# Patient Record
Sex: Female | Born: 1935 | Race: White | Hispanic: No | Smoking: Never smoker
Health system: Southern US, Community
[De-identification: ages and names within clinical notes are randomized; demographics above are authoritative.]

## PROBLEM LIST (undated history)

## (undated) DIAGNOSIS — I6381 Other cerebral infarction due to occlusion or stenosis of small artery: Secondary | ICD-10-CM

## (undated) DIAGNOSIS — I495 Sick sinus syndrome: Secondary | ICD-10-CM

## (undated) DIAGNOSIS — N289 Disorder of kidney and ureter, unspecified: Secondary | ICD-10-CM

## (undated) DIAGNOSIS — I4891 Unspecified atrial fibrillation: Principal | ICD-10-CM

## (undated) DIAGNOSIS — I509 Heart failure, unspecified: Secondary | ICD-10-CM

## (undated) DIAGNOSIS — J189 Pneumonia, unspecified organism: Secondary | ICD-10-CM

## (undated) DIAGNOSIS — R7881 Bacteremia: Secondary | ICD-10-CM

## (undated) DIAGNOSIS — K219 Gastro-esophageal reflux disease without esophagitis: Secondary | ICD-10-CM

## (undated) HISTORY — PX: TONSILLECTOMY: SUR1361

---

## 2011-12-30 ENCOUNTER — Inpatient Hospital Stay (HOSPITAL_COMMUNITY)
Admission: EM | Admit: 2011-12-30 | Discharge: 2012-01-03 | DRG: 309 | Disposition: A | Discharge status: Home / Self Care | Payer: Medicare Other | Attending: Cardiovascular Disease | Admitting: Cardiovascular Disease

## 2011-12-30 ENCOUNTER — Encounter: Payer: Self-pay | Admitting: Emergency Medicine

## 2011-12-30 DIAGNOSIS — Z7901 Long term (current) use of anticoagulants: Secondary | ICD-10-CM

## 2011-12-30 DIAGNOSIS — I6381 Other cerebral infarction due to occlusion or stenosis of small artery: Secondary | ICD-10-CM

## 2011-12-30 DIAGNOSIS — K219 Gastro-esophageal reflux disease without esophagitis: Secondary | ICD-10-CM | POA: Diagnosis present

## 2011-12-30 DIAGNOSIS — Z79899 Other long term (current) drug therapy: Secondary | ICD-10-CM

## 2011-12-30 DIAGNOSIS — I4891 Unspecified atrial fibrillation: Secondary | ICD-10-CM

## 2011-12-30 DIAGNOSIS — Z7982 Long term (current) use of aspirin: Secondary | ICD-10-CM

## 2011-12-30 DIAGNOSIS — Z8673 Personal history of transient ischemic attack (TIA), and cerebral infarction without residual deficits: Secondary | ICD-10-CM

## 2011-12-30 DIAGNOSIS — N390 Urinary tract infection, site not specified: Secondary | ICD-10-CM | POA: Diagnosis present

## 2011-12-30 DIAGNOSIS — R4182 Altered mental status, unspecified: Secondary | ICD-10-CM

## 2011-12-30 HISTORY — DX: Unspecified atrial fibrillation: I48.91

## 2011-12-30 HISTORY — DX: Gastro-esophageal reflux disease without esophagitis: K21.9

## 2011-12-30 HISTORY — DX: Other cerebral infarction due to occlusion or stenosis of small artery: I63.81

## 2011-12-30 NOTE — ED Notes (Signed)
Pt's niece st's she noticed this am that pt just did not seem right.  St's approx.  7pm tonight she spoke to pt on phone and pt could not finish a sentence.  Pt is alert and orineted x's 3 at this time but has to think for a while before answering.  Niece st's this am pt forgot to put underwear on.  Forgetting where her clothes are located. Pt lives alone.

## 2011-12-31 ENCOUNTER — Emergency Department (HOSPITAL_COMMUNITY): Payer: Medicare Other

## 2011-12-31 ENCOUNTER — Other Ambulatory Visit: Payer: Self-pay

## 2011-12-31 ENCOUNTER — Encounter (HOSPITAL_COMMUNITY): Payer: Self-pay | Admitting: Physical Medicine and Rehabilitation

## 2011-12-31 DIAGNOSIS — I4891 Unspecified atrial fibrillation: Principal | ICD-10-CM

## 2011-12-31 DIAGNOSIS — N390 Urinary tract infection, site not specified: Secondary | ICD-10-CM

## 2011-12-31 LAB — CARDIAC PANEL(CRET KIN+CKTOT+MB+TROPI)
CK, MB: 2.8 ng/mL (ref 0.3–4.0)
CK, MB: 2.8 ng/mL (ref 0.3–4.0)
Relative Index: 1.8 (ref 0.0–2.5)
Troponin I: <0.3 ng/mL (ref <0.30)
Troponin I: 0.37 ng/mL (ref <0.30)

## 2011-12-31 LAB — POCT I-STAT TROPONIN I: Troponin i, poc: 0.22 ng/mL (ref 0.00–0.08)

## 2011-12-31 LAB — DIFFERENTIAL
Basophils Relative: 0 % (ref 0–1)
Eosinophils Absolute: 0 10*3/uL (ref 0.0–0.7)
Neutro Abs: 5.7 10*3/uL (ref 1.7–7.7)
Neutrophils Relative %: 59 % (ref 43–77)

## 2011-12-31 LAB — URINE MICROSCOPIC-ADD ON

## 2011-12-31 LAB — URINALYSIS, ROUTINE W REFLEX MICROSCOPIC
Nitrite: NEGATIVE
Protein, ur: NEGATIVE mg/dL
Specific Gravity, Urine: 1.026 (ref 1.005–1.030)
Urobilinogen, UA: 0.2 mg/dL (ref 0.0–1.0)

## 2011-12-31 LAB — CBC
MCH: 31.7 pg (ref 26.0–34.0)
MCHC: 34.6 g/dL (ref 30.0–36.0)
Platelets: 238 10*3/uL (ref 150–400)
RBC: 4.98 MIL/uL (ref 3.87–5.11)

## 2011-12-31 LAB — APTT: aPTT: 31 seconds (ref 24–37)

## 2011-12-31 LAB — GLUCOSE, CAPILLARY

## 2011-12-31 LAB — HEMOGLOBIN A1C
Hgb A1c MFr Bld: 6.2 % — ABNORMAL HIGH (ref <5.7)
Mean Plasma Glucose: 131 mg/dL — ABNORMAL HIGH (ref <117)

## 2011-12-31 LAB — POCT I-STAT, CHEM 8
Calcium, Ion: 1.09 mmol/L — ABNORMAL LOW (ref 1.12–1.32)
Chloride: 100 mEq/L (ref 96–112)
HCT: 45 % (ref 36.0–46.0)
Potassium: 3.7 mEq/L (ref 3.5–5.1)
Sodium: 138 mEq/L (ref 135–145)

## 2011-12-31 LAB — PROTIME-INR: INR: 1.1 (ref 0.00–1.49)

## 2011-12-31 MED ORDER — SODIUM CHLORIDE 0.9 % IJ SOLN
3.0000 mL | INTRAMUSCULAR | Status: DC | PRN
  Administered 2012-01-02: 3 mL via INTRAVENOUS
  Administered 2012-01-02: 12:00:00 via INTRAVENOUS

## 2011-12-31 MED ORDER — SODIUM CHLORIDE 0.9 % IJ SOLN
3.0000 mL | Freq: Two times a day (BID) | INTRAMUSCULAR | Status: DC
  Administered 2011-12-31 – 2012-01-03 (×5): 3 mL via INTRAVENOUS

## 2011-12-31 MED ORDER — CIPROFLOXACIN HCL 250 MG PO TABS
250.0000 mg | ORAL_TABLET | Freq: Two times a day (BID) | ORAL | Status: AC
  Administered 2011-12-31 – 2012-01-02 (×6): 250 mg via ORAL
  Filled 2011-12-31 (×7): qty 1

## 2011-12-31 MED ORDER — ASPIRIN EC 81 MG PO TBEC
81.0000 mg | DELAYED_RELEASE_TABLET | Freq: Every day | ORAL | Status: DC
  Administered 2012-01-01: 81 mg via ORAL
  Filled 2011-12-31 (×3): qty 1

## 2011-12-31 MED ORDER — ASPIRIN 81 MG PO CHEW
324.0000 mg | CHEWABLE_TABLET | Freq: Once | ORAL | Status: AC
  Administered 2011-12-31: 324 mg via ORAL
  Filled 2011-12-31: qty 4

## 2011-12-31 MED ORDER — DILTIAZEM HCL 100 MG IV SOLR
5.0000 mg/h | INTRAVENOUS | Status: AC
  Administered 2011-12-31 (×2): 20 mg/h via INTRAVENOUS
  Administered 2011-12-31: 15 mg/h via INTRAVENOUS
  Administered 2011-12-31: 20 mg/h via INTRAVENOUS
  Administered 2012-01-01 (×3): 15 mg/h via INTRAVENOUS
  Filled 2011-12-31 (×4): qty 100

## 2011-12-31 MED ORDER — HEPARIN SOD (PORCINE) IN D5W 100 UNIT/ML IV SOLN
750.0000 [IU]/h | INTRAVENOUS | Status: DC
  Administered 2011-12-31 – 2012-01-01 (×2): 800 [IU]/h via INTRAVENOUS
  Administered 2012-01-03: 750 [IU]/h via INTRAVENOUS
  Filled 2011-12-31 (×4): qty 250

## 2011-12-31 MED ORDER — HEPARIN BOLUS VIA INFUSION
2800.0000 [IU] | Freq: Once | INTRAVENOUS | Status: AC
  Administered 2011-12-31: 2800 [IU] via INTRAVENOUS

## 2011-12-31 MED ORDER — ONDANSETRON HCL 4 MG/2ML IJ SOLN: 4.0000 mg | Freq: Four times a day (QID) | INTRAMUSCULAR | Status: DC | PRN

## 2011-12-31 MED ORDER — SODIUM CHLORIDE 0.9 % IV BOLUS (SEPSIS)
1000.0000 mL | Freq: Once | INTRAVENOUS | Status: AC
  Administered 2011-12-31: 1000 mL via INTRAVENOUS

## 2011-12-31 MED ORDER — SODIUM CHLORIDE 0.9 % IV SOLN
250.0000 mL | INTRAVENOUS | Status: DC | PRN
  Administered 2011-12-31: 20 mL via INTRAVENOUS

## 2011-12-31 MED ORDER — ACETAMINOPHEN 325 MG PO TABS: 650.0000 mg | ORAL_TABLET | ORAL | Status: DC | PRN

## 2011-12-31 MED ORDER — WARFARIN SODIUM 5 MG PO TABS
5.0000 mg | ORAL_TABLET | Freq: Once | ORAL | Status: AC
  Administered 2011-12-31: 5 mg via ORAL
  Filled 2011-12-31: qty 1

## 2011-12-31 MED ORDER — DILTIAZEM HCL 50 MG/10ML IV SOLN
10.0000 mg | Freq: Once | INTRAVENOUS | Status: AC
  Administered 2011-12-31: 10 mg via INTRAVENOUS

## 2011-12-31 MED ORDER — COUMADIN BOOK
Freq: Once | Status: AC
  Administered 2011-12-31: 14:00:00
  Filled 2011-12-31: qty 1

## 2011-12-31 MED ORDER — WARFARIN VIDEO
Freq: Once | Status: AC
  Administered 2011-12-31: 14:00:00

## 2011-12-31 MED ORDER — SULFAMETHOXAZOLE-TMP DS 800-160 MG PO TABS: 1.0000 | ORAL_TABLET | Freq: Two times a day (BID) | ORAL | Status: DC

## 2011-12-31 MED ORDER — DILTIAZEM HCL 100 MG IV SOLR
5.0000 mg/h | Freq: Once | INTRAVENOUS | Status: AC
  Administered 2011-12-31: 10 mg/h via INTRAVENOUS

## 2011-12-31 NOTE — Progress Notes (Signed)
Iv team called for second iv, due to pt being on dil gtts, and needing to start heaprin.

## 2011-12-31 NOTE — Progress Notes (Signed)
  Patient Name: [name] Daniels      SUBJECTIVE: Patient presented to the hospital with atrial fibrillation and rapid ventricular response with complaints of weakness. She was treated with heparin and Cardizem her heart rate is some improved.  Past Medical History  Diagnosis Date  . Campath-induced atrial fibrillation   . Shortness of breath   . GERD (gastroesophageal reflux disease)     PHYSICAL EXAM Filed Vitals:   12/31/11 0900 12/31/11 0915 12/31/11 0945 12/31/11 1000  BP: 78/62 92/56 90/45  87/42  Pulse: 102 148 79 114  Temp:      TempSrc:      Resp: 21 21 18 18   Height:      Weight:      SpO2: 96% 95% 95% 96%    Well developed and nourished in no acute distress HENT normal Neck supple with JVP-flat Carotids brisk and full without bruits Clear Irregularly irregular rate and rhythm with  Rapid  ventricular response, no murmurs or gallops Abd-soft with active BS without hepatomegaly No Clubbing cyanosis edema Skin-warm and dry A & Oriented  Grossly normal sensory and motor function   TELEMETRY: Reviewed telemetry pt in af with variable ventriaular response:   No intake or output data in the 24 hours ending 12/31/11 1025  LABS: Basic Metabolic Panel:  Lab 12/31/11 1610  NA 138  K 3.7  CL 100  CO2 --  GLUCOSE 119*  BUN 7  CREATININE 0.80  CALCIUM --  MG --  PHOS --   Cardiac Enzymes: No results found for this basename: CKTOTAL:3,CKMB:3,CKMBINDEX:3,TROPONINI:3 in the last 72 hours CBC:  Lab 12/31/11 0120 12/31/11 0026  WBC -- 9.6  NEUTROABS -- 5.7  HGB 15.3* 15.8*  HCT 45.0 45.7  MCV -- 91.8  PLT -- 238      ASSESSMENT AND PLAN:  The patient presents with atrial fibrillation of unknown duration with rapid ventricular response in the setting of urinary tract infection. Hypotension has made control the rate difficult. Thyroid functions are pending. We will begin Coumadin in anticipation of cardioversion; there are no great data  for alternative  anticoagulants with heparin. Await echo and TSH Begin trimethoprim sulfa for UTI x3 days  Patient Active Hospital Problem List: Atrial fibrillation (12/31/2011)   UTI (lower urinary tract infection) (12/31/2011)     Signed, Sherryl Manges MD  12/31/2011

## 2011-12-31 NOTE — ED Notes (Signed)
RN and MD notified of pt's change to vital signs

## 2011-12-31 NOTE — Progress Notes (Signed)
Pt received from ed via stretcher, pt in no apparent distress. Pt placed on tele monitor, vss, afib on tele monitor, diltazem gtts infusing thru 20G left ac, site appears wnl. Dr. [name] in to see pt. Will continue to monitor.

## 2011-12-31 NOTE — Progress Notes (Signed)
CRITICAL VALUE ALERT  Critical value received:  troponin 0.037 Date of notification:  12/31/2011  Time of notification:  1245  Critical value read back:yes  Nurse who received alert:  Marcy Salvo rn  MD notified (1st page):  Hurman Horn md  Time of first page:  1245  MD notified (2nd page):  Time of second page:  Responding MD:  Hurman Horn md  Time MD responded:  1245

## 2011-12-31 NOTE — H&P (Signed)
[name] Daniels is an 78 y.o. female.    Chief Complaint:  "feeling sick"  History was obtained from patient and the patient's niece, who is the caretaker Gavin Pound March [phone])  HPI: 78 y/o female with no significant PMH presented to ER with complains of "feeling sick" for 2 days prior to presentation.  She complained of feeling sick when she was a [employer] 2 days prior consisting of nausea with no vomiting.  She denies any chest pain, shortness of breath, PND, orthopnea, dizziness or syncope. In ER, she was found to be in AFIB with RVR with heart rates ranging from 120 bpm to 170 bpm. Patient remained hemodynamically stable and was asymptomatic throughout.  Her initial cardiac markers shows a troponin-I of 0.22. She had a CT-head which did not show any evidence of acute intracranial abnormality and a CXR which did not show any evidence of pulmonary edema. She was started on heparin and Cardizem drip in theER.  History reviewed. No pertinent past medical history.  No past surgical history on file.  No family history on file. Social History:  reports that she has never smoked. She does not have any smokeless tobacco history on file. She reports that she does not drink alcohol or use illicit drugs.  Allergies: No Known Allergies  Medications Prior to Admission  Medication Dose Route Frequency Provider Last Rate Last Dose  . aspirin chewable tablet 324 mg  324 mg Oral Once April K Palumbo-Rasch, MD   324 mg at 12/31/11 0222  . diltiazem (CARDIZEM) 100 mg in dextrose 5 % 100 mL infusion  5-15 mg/hr Intravenous Once April K Palumbo-Rasch, MD 15 mL/hr at 12/31/11 0300 15 mg/hr at 12/31/11 0300  . diltiazem (CARDIZEM) injection SOLN 10 mg  10 mg Intravenous Once April K Palumbo-Rasch, MD   10 mg at 12/31/11 0210  . sodium chloride 0.9 % bolus 1,000 mL  1,000 mL Intravenous Once April K Palumbo-Rasch, MD   1,000 mL at 12/31/11 0211   No current outpatient prescriptions on file as of 12/30/2011.     Results for orders placed during the hospital encounter of 12/30/11 (from the past 48 hour(s))  CBC     Status: Abnormal   Collection Time   12/31/11 12:26 AM      Component Value Range Comment   WBC 9.6  4.0 - 10.5 (K/uL)    RBC 4.98  3.87 - 5.11 (MIL/uL)    Hemoglobin 15.8 (*) 12.0 - 15.0 (g/dL)    HCT 11.9  14.7 - 82.9 (%)    MCV 91.8  78.0 - 100.0 (fL)    MCH 31.7  26.0 - 34.0 (pg)    MCHC 34.6  30.0 - 36.0 (g/dL)    RDW 56.2  13.0 - 86.5 (%)    Platelets 238  150 - 400 (K/uL)   DIFFERENTIAL     Status: Normal   Collection Time   12/31/11 12:26 AM      Component Value Range Comment   Neutrophils Relative 59  43 - 77 (%)    Neutro Abs 5.7  1.7 - 7.7 (K/uL)    Lymphocytes Relative 29  12 - 46 (%)    Lymphs Abs 2.8  0.7 - 4.0 (K/uL)    Monocytes Relative 11  3 - 12 (%)    Monocytes Absolute 1.0  0.1 - 1.0 (K/uL)    Eosinophils Relative 0  0 - 5 (%)    Eosinophils Absolute 0.0  0.0 - 0.7 (K/uL)  Basophils Relative 0  0 - 1 (%)    Basophils Absolute 0.0  0.0 - 0.1 (K/uL)   GLUCOSE, CAPILLARY     Status: Abnormal   Collection Time   12/31/11 12:26 AM      Component Value Range Comment   Glucose-Capillary 113 (*) 70 - 99 (mg/dL)    Comment 1 Documented in Chart      Comment 2 Notify RN     URINALYSIS, ROUTINE W REFLEX MICROSCOPIC     Status: Abnormal   Collection Time   12/31/11 12:34 AM      Component Value Range Comment   Color, Urine YELLOW  YELLOW     APPearance CLEAR  CLEAR     Specific Gravity, Urine 1.026  1.005 - 1.030     pH 5.5  5.0 - 8.0     Glucose, UA NEGATIVE  NEGATIVE (mg/dL)    Hgb urine dipstick NEGATIVE  NEGATIVE     Bilirubin Urine SMALL (*) NEGATIVE     Ketones, ur 15 (*) NEGATIVE (mg/dL)    Protein, ur NEGATIVE  NEGATIVE (mg/dL)    Urobilinogen, UA 0.2  0.0 - 1.0 (mg/dL)    Nitrite NEGATIVE  NEGATIVE     Leukocytes, UA MODERATE (*) NEGATIVE    URINE MICROSCOPIC-ADD ON     Status: Normal   Collection Time   12/31/11 12:34 AM      Component Value  Range Comment   Squamous Epithelial / LPF RARE  RARE     WBC, UA 7-10  <3 (WBC/hpf)    RBC / HPF 0-2  <3 (RBC/hpf)    Bacteria, UA RARE  RARE     Urine-Other MUCOUS PRESENT     POCT I-STAT TROPONIN I     Status: Abnormal   Collection Time   12/31/11  1:18 AM      Component Value Range Comment   Troponin i, poc 0.22 (*) 0.00 - 0.08 (ng/mL)    Comment NOTIFIED PHYSICIAN      Comment 3            POCT I-STAT, CHEM 8     Status: Abnormal   Collection Time   12/31/11  1:20 AM      Component Value Range Comment   Sodium 138  135 - 145 (mEq/L)    Potassium 3.7  3.5 - 5.1 (mEq/L)    Chloride 100  96 - 112 (mEq/L)    BUN 7  6 - 23 (mg/dL)    Creatinine, Ser 9.14  0.50 - 1.10 (mg/dL)    Glucose, Bld 782 (*) 70 - 99 (mg/dL)    Calcium, Ion 9.56 (*) 1.12 - 1.32 (mmol/L)    TCO2 26  0 - 100 (mmol/L)    Hemoglobin 15.3 (*) 12.0 - 15.0 (g/dL)    HCT 21.3  08.6 - 57.8 (%)    Dg Chest 2 View  12/31/2011  *RADIOLOGY REPORT*  Clinical Data: Altered mental status.  CHEST - 2 VIEW  Comparison: None.  Findings: Hyperinflation.  Mild interstitial prominence.  Mild left lung base scarring versus atelectasis.  Small hiatal hernia. Otherwise, no focal areas of consolidation.  Aortic arch atherosclerotic calcification.  Heart size within upper normal limits.  Mild compression deformity of a lower thoracic/upper lumbar vertebrae.  Osteopenia.  IMPRESSION: Mild hyperinflation and left lung base scarring versus atelectasis.  Small hiatal hernia.  Mild age indeterminate compression deformity of a lower thoracic and upper lumbar vertebra.  Original Report Authenticated By:  Waneta Martins, M.D.   Ct Head Wo Contrast  12/31/2011  *RADIOLOGY REPORT*  Clinical Data: Altered mental status  CT HEAD WITHOUT CONTRAST  Technique:  Contiguous axial images were obtained from the base of the skull through the vertex without contrast.  Comparison: None.  Findings: Prominence of the sulci, cisterns, and ventricles, in keeping with  volume loss. There are subcortical and periventricular white matter hypodensities, a nonspecific finding most often seen with chronic microangiopathic changes.  There is no evidence for acute hemorrhage, overt hydrocephalus, mass lesion, or abnormal extra-axial fluid collection.  No definite CT evidence for acute cortical based (large artery) infarction. Bilateral remote appearing lacunar infarctions, including within the basal ganglia, thalami, and left corona radiata. Postoperative changes involving the globes. The visualized paranasal sinuses and mastoid air cells are predominately clear.  IMPRESSION: White matter changes and remote appearing lacunar infarctions.  No definite acute intracranial abnormality.  Original Report Authenticated By: Waneta Martins, M.D.    Review of Systems  Constitutional: Negative.   HENT: Negative.   Eyes: Negative.   Respiratory: Negative.   Cardiovascular: Negative.   Gastrointestinal: Positive for nausea. Negative for heartburn, vomiting, abdominal pain, diarrhea, constipation and blood in stool.  Genitourinary: Negative.   Musculoskeletal: Negative.   Skin: Negative.   Neurological: Negative.     Blood pressure 122/82, pulse 126, temperature 98.5 F (36.9 C), temperature source Oral, resp. rate 17, SpO2 98.00%. Physical Exam  Constitutional: She appears well-developed and well-nourished.  HENT:  Head: Normocephalic.  Eyes: EOM are normal.  Neck: Normal range of motion. Neck supple.  Cardiovascular: An irregularly irregular rhythm present. Exam reveals no gallop and no friction rub.   No murmur heard. Respiratory: Effort normal and breath sounds normal.  GI: Soft. Bowel sounds are normal.  Musculoskeletal: Normal range of motion.  Neurological: She is alert.  Psychiatric: She has a normal mood and affect.     Assessment/Plan  1. AFIB with RVR 2. Elevated troponins likely secondary to demand ischemia from AFIB with RVR  Patient is currently  on Cardizem drip and Heparin drip. CHADSVASc score = 3 (age>75 and female).  It is likely that she has been in AFIB since at least 2 days prior to presentation.  I discussed several treatment strategies with patient and caretaker, including rate control and long-term anticoagulation (coumadin vz new anti-coagulants), TEE cardioversion and ablation therapy. They are currently not committed to any one treatment strategy at this time. Thus, will continue Cardizem and Heparin drip for now, and order a 2-D echo in the morning to evaluate her LV function.  I will cycle her cardiac markers, check her thyroid function test and add Aspirin 81 mg q day since she has elevated troponins.      Trini Christiansen E 12/31/2011, 3:49 AM

## 2011-12-31 NOTE — Progress Notes (Addendum)
ANTICOAGULATION CONSULT NOTE - Initial Consult  Pharmacy Consult for Heparin/Warfarin  Indication: atrial fibrillation  Assessment: [name] Daniels is a 10 YOF found to be in AFIB with RVR to be started on heparin drip per pharmacy. Hbg 15.3, Plts 238. Pt is not currently on heparin, nor does it seem that she was given any in the ED. Spoke with nurse to confirm this.   Goal of Therapy:  Heparin level 0.3-0.7 units/ml   Plan:  1. Heparin bolus of 2800 units IV 2. Then heparin drip at 800 units/hr (94ml/hr) 3. Obtain heparin level 8 hours post start of drip, timed collect 4. Daily heparin level starting 01/01/12 and CBC  +++++Addendum+++++ Warfarin per pharmacy added to heparin for bridge. Baseline INR 1.10.   Plan: 1. Warfarin 5mg  po x 1 today at 1800 2. Daily INR starting 01/01/12 3. Coumadin book and educational video   Thank you,  Brett Fairy, PharmD Pager: [phone]  12/31/2011 9:57 AM  No Known Allergies  Patient Measurements: Height: 5' (152.4 cm) Weight: 120 lb 9.5 oz (54.7 kg) IBW/kg (Calculated) : 45.5   Vital Signs: Temp: 98.1 F (36.7 C) (01/05 0851) Temp src: Oral (01/05 0851) BP: 97/48 mmHg (01/05 0851) Pulse Rate: 124  (01/05 0851)  Labs:  Basename 12/31/11 0120 12/31/11 0026  HGB 15.3* 15.8*  HCT 45.0 45.7  PLT -- 238  APTT -- --  LABPROT -- --  INR -- --  HEPARINUNFRC -- --  CREATININE 0.80 --  CKTOTAL -- --  CKMB -- --  TROPONINI -- --   Estimated Creatinine Clearance: 44.3 ml/min (by C-G formula based on Cr of 0.8).  Medical History: Past Medical History  Diagnosis Date  . Angina   . Shortness of breath   . GERD (gastroesophageal reflux disease)   . CHF (congestive heart failure)   . Coronary artery disease     Medications:  Scheduled:    . aspirin  324 mg Oral Once  . aspirin EC  81 mg Oral Daily  . diltiazem (CARDIZEM) infusion  5-15 mg/hr Intravenous Once  . diltiazem  10 mg Intravenous Once  . sodium chloride  1,000 mL  Intravenous Once  . sodium chloride  3 mL Intravenous Q12H   Infusions:    . diltiazem (CARDIZEM) infusion 20 mg/hr (12/31/11 0627)   PRN: sodium chloride, acetaminophen, ondansetron (ZOFRAN) IV, sodium chloride   Bufford Buttner, Shloka Baldridge N 12/31/2011,9:42 AM

## 2011-12-31 NOTE — ED Notes (Signed)
Pt gowned and assessed.

## 2011-12-31 NOTE — ED Provider Notes (Signed)
History     CSN: [id number]  Arrival date & time 12/30/11  2051   First MD Initiated Contact with Patient 12/31/11 0013      Chief Complaint  Patient presents with  . Altered Mental Status    (Consider location/radiation/quality/duration/timing/severity/associated sxs/prior treatment) Patient is a 78 y.o. female presenting with altered mental status. The history is provided by a relative and the patient. The history is limited by the condition of the patient. No language interpreter was used.  Altered Mental Status This is a new problem. The current episode started yesterday. The problem occurs constantly. The problem has not changed since onset.Pertinent negatives include no chest pain, no abdominal pain, no headaches and no shortness of breath. The symptoms are aggravated by nothing. The symptoms are relieved by nothing. She has tried nothing for the symptoms. The treatment provided no relief.  patient denies chest pain shortness of breath.  Patient may have been nauseated yesterday.  No f/c/r  History reviewed. No pertinent past medical history.  No past surgical history on file.  No family history on file.  History  Substance Use Topics  . Smoking status: Never Smoker   . Smokeless tobacco: Not on file  . Alcohol Use: No    OB History    Grav Para Term Preterm Abortions TAB SAB Ect Mult Living                  Review of Systems  Constitutional: Negative for fever.  HENT: Negative for facial swelling.   Eyes: Negative for pain.  Respiratory: Negative for shortness of breath.   Cardiovascular: Negative for chest pain and leg swelling.  Gastrointestinal: Negative for abdominal pain and abdominal distention.  Genitourinary: Negative for difficulty urinating.  Musculoskeletal: Negative for arthralgias.  Skin: Negative for rash.  Neurological: Negative for headaches.  Hematological: Negative.   Psychiatric/Behavioral: Positive for altered mental status.    Allergies   Review of patient's allergies indicates no known allergies.  Home Medications   Current Outpatient Rx  Name Route Sig Dispense Refill  . ASPIRIN EC 81 MG PO TBEC Oral Take 81 mg by mouth daily.      [name] PHENYLEPH-DOXYLAMINE-DM-APAP 5-6.25-10-325 MG PO CAPS Oral Take 1 tablet by mouth every 12 (twelve) hours as needed. For allergy symptoms       BP 109/90  Pulse 147  Temp(Src) 98.5 F (36.9 C) (Oral)  Resp 18  SpO2 97%  Physical Exam  Constitutional:       frail  HENT:  Head: Normocephalic and atraumatic.  Mouth/Throat: Oropharynx is clear and moist.  Eyes: EOM are normal. Pupils are equal, round, and reactive to light.  Neck: Normal range of motion. Neck supple.  Cardiovascular: An irregular rhythm present. Tachycardia present.   Pulmonary/Chest: Effort normal and breath sounds normal. She has no wheezes.  Abdominal: Soft. Bowel sounds are normal. There is no tenderness.  Musculoskeletal: Normal range of motion. She exhibits no edema.  Neurological: She is alert. She has normal reflexes. No cranial nerve deficit.  Skin: Skin is warm and dry.  Psychiatric: She has a normal mood and affect.    ED Course  Procedures (including critical care time)  Labs Reviewed  CBC - Abnormal; Notable for the following:    Hemoglobin 15.8 (*)    All other components within normal limits  URINALYSIS, ROUTINE W REFLEX MICROSCOPIC - Abnormal; Notable for the following:    Bilirubin Urine SMALL (*)    Ketones, ur 15 (*)  Leukocytes, UA MODERATE (*)    All other components within normal limits  GLUCOSE, CAPILLARY - Abnormal; Notable for the following:    Glucose-Capillary 113 (*)    All other components within normal limits  POCT I-STAT, CHEM 8 - Abnormal; Notable for the following:    Glucose, Bld 119 (*)    Calcium, Ion 1.09 (*)    Hemoglobin 15.3 (*)    All other components within normal limits  POCT I-STAT TROPONIN I - Abnormal; Notable for the following:    Troponin i, poc 0.22  (*)    All other components within normal limits  DIFFERENTIAL  URINE MICROSCOPIC-ADD ON  I-STAT, CHEM 8  I-STAT TROPONIN I  URINE CULTURE  POCT CBG MONITORING   Dg Chest 2 View  12/31/2011  *RADIOLOGY REPORT*  Clinical Data: Altered mental status.  CHEST - 2 VIEW  Comparison: None.  Findings: Hyperinflation.  Mild interstitial prominence.  Mild left lung base scarring versus atelectasis.  Small hiatal hernia. Otherwise, no focal areas of consolidation.  Aortic arch atherosclerotic calcification.  Heart size within upper normal limits.  Mild compression deformity of a lower thoracic/upper lumbar vertebrae.  Osteopenia.  IMPRESSION: Mild hyperinflation and left lung base scarring versus atelectasis.  Small hiatal hernia.  Mild age indeterminate compression deformity of a lower thoracic and upper lumbar vertebra.  Original Report Authenticated By: Waneta Martins, M.D.   Ct Head Wo Contrast  12/31/2011  *RADIOLOGY REPORT*  Clinical Data: Altered mental status  CT HEAD WITHOUT CONTRAST  Technique:  Contiguous axial images were obtained from the base of the skull through the vertex without contrast.  Comparison: None.  Findings: Prominence of the sulci, cisterns, and ventricles, in keeping with volume loss. There are subcortical and periventricular white matter hypodensities, a nonspecific finding most often seen with chronic microangiopathic changes.  There is no evidence for acute hemorrhage, overt hydrocephalus, mass lesion, or abnormal extra-axial fluid collection.  No definite CT evidence for acute cortical based (large artery) infarction. Bilateral remote appearing lacunar infarctions, including within the basal ganglia, thalami, and left corona radiata. Postoperative changes involving the globes. The visualized paranasal sinuses and mastoid air cells are predominately clear.  IMPRESSION: White matter changes and remote appearing lacunar infarctions.  No definite acute intracranial abnormality.   Original Report Authenticated By: Waneta Martins, M.D.     1. Atrial fibrillation with rapid ventricular response   2. Altered mental state       MDM   Date: 12/31/2011  Rate: 182  Rhythm: atrial fibrillation  QRS Axis: normal  Intervals: indeterminant  ST/T Wave abnormalities: nonspecific ST changes  Conduction Disutrbances:none  Narrative Interpretation:   Old EKG Reviewed: none available   MDM Reviewed: vitals and nursing note Interpretation: ECG, labs and x-ray Consults: cardiology        Boen Sterbenz K Shaindy Reader-Rasch, MD 12/31/11 [phone]

## 2012-01-01 ENCOUNTER — Encounter (HOSPITAL_COMMUNITY): Payer: Self-pay | Admitting: Internal Medicine

## 2012-01-01 ENCOUNTER — Other Ambulatory Visit: Payer: Self-pay

## 2012-01-01 DIAGNOSIS — I6381 Other cerebral infarction due to occlusion or stenosis of small artery: Secondary | ICD-10-CM

## 2012-01-01 DIAGNOSIS — I4891 Unspecified atrial fibrillation: Principal | ICD-10-CM

## 2012-01-01 HISTORY — DX: Other cerebral infarction due to occlusion or stenosis of small artery: I63.81

## 2012-01-01 LAB — HEPARIN LEVEL (UNFRACTIONATED): Heparin Unfractionated: 0.69 IU/mL (ref 0.30–0.70)

## 2012-01-01 LAB — URINE CULTURE

## 2012-01-01 LAB — CBC
HCT: 37.3 % (ref 36.0–46.0)
MCHC: 34 g/dL (ref 30.0–36.0)
MCV: 90.8 fL (ref 78.0–100.0)
RDW: 14.7 % (ref 11.5–15.5)

## 2012-01-01 LAB — LIPID PANEL
Total CHOL/HDL Ratio: 2.7 RATIO
VLDL: 18 mg/dL (ref 0–40)

## 2012-01-01 LAB — PROTIME-INR: INR: 1.25 (ref 0.00–1.49)

## 2012-01-01 MED ORDER — DILTIAZEM HCL 30 MG PO TABS
30.0000 mg | ORAL_TABLET | Freq: Four times a day (QID) | ORAL | Status: DC
  Administered 2012-01-01 – 2012-01-02 (×2): 30 mg via ORAL
  Filled 2012-01-01 (×6): qty 1

## 2012-01-01 MED ORDER — WARFARIN SODIUM 5 MG PO TABS
5.0000 mg | ORAL_TABLET | Freq: Once | ORAL | Status: AC
  Administered 2012-01-01: 5 mg via ORAL
  Filled 2012-01-01: qty 1

## 2012-01-01 MED ORDER — SODIUM CHLORIDE 0.9 % IJ SOLN: 3.0000 mL | INTRAMUSCULAR | Status: DC | PRN

## 2012-01-01 MED ORDER — SODIUM CHLORIDE 0.9 % IJ SOLN
3.0000 mL | Freq: Two times a day (BID) | INTRAMUSCULAR | Status: DC
Start: 2012-01-01 — End: 2012-01-01

## 2012-01-01 MED ORDER — SODIUM CHLORIDE 0.45 % IV SOLN
INTRAVENOUS | Status: DC
  Administered 2012-01-01: 21:00:00 via INTRAVENOUS

## 2012-01-01 MED ORDER — SODIUM CHLORIDE 0.9 % IV SOLN: 250.0000 mL | INTRAVENOUS | Status: DC | PRN

## 2012-01-01 MED ORDER — DILTIAZEM HCL ER COATED BEADS 120 MG PO CP24: 120.0000 mg | ORAL_CAPSULE | Freq: Every day | ORAL | Status: DC

## 2012-01-01 NOTE — Progress Notes (Signed)
Received page at 9pm that patient had converted earlier this evening to sinus rhythm after 4pm. Now NSR rate 70's, BP 111 systolic. Remains on dilt gtt. Will convert to short-acting po diltiazem with initially lower dosing given soft BP's this admission. Will d/c dilt gtt 1 hr after first dose. RN aware. Will also send message in EPIC to AM PAs to be aware of potentially cancelled TEE/DCCV (I will be in tomorrow as well and will make sure Rosann Auerbach is aware).  Dayna Dunn PA-C

## 2012-01-01 NOTE — Progress Notes (Signed)
ANTICOAGULATION CONSULT NOTE - Follow Up Consult  Pharmacy Consult for Heparin/Coumadin Indication: atrial fibrillation  Assessment: [name] Daniels is a 38 yof with afib on heparin and coumadin bridge. HL 0.92 (likely from heparin bolus) and HL this morning 0.69. No bleeding noted. CBC today is stable, plts good. INR 1.25. Noted pt was on bactrim, which could elevate INR but pt is now on cipro instead which has a slight effect (stop dates in place).   Goal of Therapy:  Heparin level 0.3-0.7 units/ml INR 2.0-3.0   Plan:  1. Decrease heparin to 750 units/hr (7.9ml/hr) 2. F/u HL in AM 3. Coumadin 5mg  x 1 today at 1800 4. F/u INR in AM   Thank you,  Brett Fairy, PharmD Pager: [phone]  01/01/2012 1:34 PM   No Known Allergies  Patient Measurements: Height: 5' (152.4 cm) Weight: 120 lb 9.5 oz (54.7 kg) IBW/kg (Calculated) : 45.5   Vital Signs: Temp: 98.3 F (36.8 C) (01/06 1140) Temp src: Oral (01/06 1140) BP: 97/39 mmHg (01/06 0700) Pulse Rate: 76  (01/06 0700)  Labs:  Basename 01/01/12 0630 12/31/11 1823 12/31/11 1822 12/31/11 1143 12/31/11 0931 12/31/11 0120 12/31/11 0026  HGB 12.7 -- -- -- -- 15.3* --  HCT 37.3 -- -- -- -- 45.0 45.7  PLT 269 -- -- -- -- -- 238  APTT -- -- -- -- 31 -- --  LABPROT 16.0* -- -- -- 14.4 -- --  INR 1.25 -- -- -- 1.10 -- --  HEPARINUNFRC 0.69 0.92* -- -- -- -- --  CREATININE -- -- -- -- -- 0.80 --  CKTOTAL -- -- 166 155 150 -- --  CKMB -- -- 2.8 2.8 2.9 -- --  TROPONINI -- -- 0.30* 0.37* <0.30 -- --   Estimated Creatinine Clearance: 44.3 ml/min (by C-G formula based on Cr of 0.8).  Medications:  Scheduled:    . aspirin EC  81 mg Oral Daily  . ciprofloxacin  250 mg Oral BID  . coumadin book   Does not apply Once  . heparin  2,800 Units Intravenous Once  . sodium chloride  3 mL Intravenous Q12H  . warfarin  5 mg Oral ONCE-1800  . warfarin   Does not apply Once   Infusions:    . diltiazem (CARDIZEM) infusion 15 mg/hr (01/01/12  1128)  . heparin 800 Units/hr (01/01/12 1203)   PRN: sodium chloride, acetaminophen, ondansetron (ZOFRAN) IV, sodium chloride  Bufford Buttner, [place] Hester N 01/01/2012,1:21 PM

## 2012-01-01 NOTE — Progress Notes (Signed)
  Patient Name: [name] Daniels      SUBJECTIVE: Patient presented to the hospital with atrial fibrillation and rapid ventricular response with complaints of weakness. She was treated with heparin and Cardizem her heart rate is some improved.  Past Medical History  Diagnosis Date  . Campath-induced atrial fibrillation   . Shortness of breath   . GERD (gastroesophageal reflux disease)     PHYSICAL EXAM Filed Vitals:   01/01/12 0400 01/01/12 0500 01/01/12 0600 01/01/12 0700  BP: 88/45 92/63 103/69 97/39  Pulse: 108 75 86 76  Temp: 98.3 F (36.8 C)   97.9 F (36.6 C)  TempSrc: Oral   Oral  Resp: 20 19 20 17   Height:      Weight:      SpO2: 93% 93% 94% 92%    Well developed and nourished in no acute distress HENT normal Neck supple with JVP-flat Carotids brisk and full without bruits Clear Irregularly irregular rate and rhythm with  Rapid  ventricular response, no murmurs or gallops Abd-soft with active BS without hepatomegaly No Clubbing cyanosis edema Skin-warm and dry A & Oriented  Grossly normal sensory and motor function but according to the family her speech and recollections are not up to par   TELEMETRY: Reviewed telemetry pt in af with variable ventriaular response:    Intake/Output Summary (Last 24 hours) at 01/01/12 1039 Last data filed at 01/01/12 0900  Gross per 24 hour  Intake 1601.33 ml  Output   1050 ml  Net 551.33 ml    LABS: Basic Metabolic Panel:  Lab 12/31/11 1610  NA 138  K 3.7  CL 100  CO2 --  GLUCOSE 119*  BUN 7  CREATININE 0.80  CALCIUM --  MG --  PHOS --   Cardiac Enzymes:  Basename 12/31/11 1822 12/31/11 1143 12/31/11 0931  CKTOTAL 166 155 150  CKMB 2.8 2.8 2.9  CKMBINDEX -- -- --  TROPONINI 0.30* 0.37* <0.30   CBC:  Lab 01/01/12 0630 12/31/11 0120 12/31/11 0026  WBC 8.6 -- 9.6  NEUTROABS -- -- 5.7  HGB 12.7 15.3* 15.8*  HCT 37.3 45.0 45.7  MCV 90.8 -- 91.8  PLT 269 -- 238   TSH 2.69   ASSESSMENT AND  PLAN:  The patient presents with atrial fibrillation of unknown duration with rapid ventricular response in the setting of urinary tract infection. Hypotension has made control the rate difficult. The initial concern from the family was whether she had a stroke because of changes in mental status  CT scan had shown prior lacunar infarcts without acute  She is on Coumadin in anticipation of cardioversion; there are no great data  for alternative anticoagulants with heparin . Await echo  Begin trimethoprim sulfa for UTI x3 days  Anticipate TE guided cardioversion tomorrow as rate control has been near impossible.  Patient Active Hospital Problem List:  Atrial fibrillation (12/31/2011)  Prior stroke  UTI (lower urinary tract infection) (12/31/2011)     Signed, Sherryl Manges MD  01/01/2012

## 2012-01-01 NOTE — Progress Notes (Signed)
  Echocardiogram 2D Echocardiogram has been performed.  Cathie Beams Deneen 01/01/2012, 11:57 AM

## 2012-01-01 NOTE — Plan of Care (Signed)
Problem: Phase I Progression Outcomes Goal: Heart rate or rhythm control medication Outcome: Progressing cardizem gtt infusing

## 2012-01-02 ENCOUNTER — Encounter (HOSPITAL_COMMUNITY)
Admission: EM | Disposition: A | Discharge status: Home / Self Care | Payer: Self-pay | Source: Home / Self Care | Attending: Cardiovascular Disease

## 2012-01-02 LAB — PROTIME-INR
INR: 1.79 — ABNORMAL HIGH (ref 0.00–1.49)
Prothrombin Time: 21.1 seconds — ABNORMAL HIGH (ref 11.6–15.2)

## 2012-01-02 LAB — CBC
Platelets: 238 10*3/uL (ref 150–400)
RDW: 14.9 % (ref 11.5–15.5)
WBC: 9.1 10*3/uL (ref 4.0–10.5)

## 2012-01-02 LAB — HEPARIN LEVEL (UNFRACTIONATED): Heparin Unfractionated: 0.45 IU/mL (ref 0.30–0.70)

## 2012-01-02 SURGERY — TRANSESOPHAGEAL ECHOCARDIOGRAM WITH CARDIOVERSION
Anesthesia: Choice

## 2012-01-02 MED ORDER — WARFARIN SODIUM 2.5 MG PO TABS
2.5000 mg | ORAL_TABLET | Freq: Once | ORAL | Status: AC
  Administered 2012-01-02: 2.5 mg via ORAL
  Filled 2012-01-02: qty 1

## 2012-01-02 MED ORDER — DILTIAZEM HCL ER COATED BEADS 120 MG PO CP24
120.0000 mg | ORAL_CAPSULE | Freq: Every day | ORAL | Status: DC
  Administered 2012-01-02 – 2012-01-03 (×2): 120 mg via ORAL
  Filled 2012-01-02 (×2): qty 1

## 2012-01-02 NOTE — Progress Notes (Signed)
Report was called to RN on unit 3700. Pt will transfer to room 3733.  Family is at bedside.

## 2012-01-02 NOTE — Plan of Care (Signed)
Problem: Phase II Progression Outcomes Goal: Ventricular heart rate < 100/min Outcome: Completed/Met Date Met:  01/02/12 Cardiac monitor denotes SR w/PACs Goal: Anticoagulation Therapy per MD order Outcome: Completed/Met Date Met:  01/02/12 Heparin drip infusing

## 2012-01-02 NOTE — Progress Notes (Signed)
@   Subjective:  Denies CP or dyspnea   Objective:  Filed Vitals:   01/02/12 0345 01/02/12 0400 01/02/12 0500 01/02/12 0745  BP: 107/60   129/39  Pulse: 57   75  Temp:  98.3 F (36.8 C)  97.6 F (36.4 C)  TempSrc:  Oral  Oral  Resp: 17   19  Height:      Weight:   119 lb 11.4 oz (54.3 kg)   SpO2: 99%   93%    Intake/Output from previous day:  Intake/Output Summary (Last 24 hours) at 01/02/12 1610 Last data filed at 01/02/12 0700  Gross per 24 hour  Intake 1356.9 ml  Output    650 ml  Net  706.9 ml    Physical Exam: Physical exam: Well-developed well-nourished in no acute distress.  Skin is warm and dry.  HEENT is normal.  Neck is supple. No thyromegaly.  Chest is clear to auscultation with normal expansion.  Cardiovascular exam is regular rate and rhythm.  Abdominal exam nontender or distended. No masses palpated. Extremities show no edema. neuro grossly intact    Lab Results: Basic Metabolic Panel:  Basename 12/31/11 0120  NA 138  K 3.7  CL 100  CO2 --  GLUCOSE 119*  BUN 7  CREATININE 0.80  CALCIUM --  MG --  PHOS --   CBC:  Basename 01/02/12 0657 01/01/12 0630 12/31/11 0026  WBC 9.1 8.6 --  NEUTROABS -- -- 5.7  HGB 11.3* 12.7 --  HCT 33.4* 37.3 --  MCV 91.0 90.8 --  PLT 238 269 --   Cardiac Enzymes:  Basename 12/31/11 1822 12/31/11 1143 12/31/11 0931  CKTOTAL 166 155 150  CKMB 2.8 2.8 2.9  CKMBINDEX -- -- --  TROPONINI 0.30* 0.37* <0.30     Assessment/Plan:  1) PAF - patient has converted to NSR; LV function normal; TSH normal; change cardizem to CD; continue heparin and coumadin. Given prior CVAs noted on head CT, favor continuing heparin until INR > 2. 2) Elevated troponin - most likely from atrial fibrillation; outpatient myoview for risk stratification. 3) possible UTI - complete ciprofloxacin 4) Elevated hga1c fu with his primary care  Olga Millers 01/02/2012, 9:37 AM

## 2012-01-02 NOTE — Progress Notes (Signed)
ANTICOAGULATION CONSULT NOTE - Follow Up Consult  Pharmacy Consult for Heparin/Coumadin Indication: atrial fibrillation  No Known Allergies  Patient Measurements: Height: 5' (152.4 cm) Weight: 119 lb 11.4 oz (54.3 kg) IBW/kg (Calculated) : 45.5   Vital Signs: Temp: 97.6 F (36.4 C) (01/07 0745) Temp src: Oral (01/07 0745) BP: 129/39 mmHg (01/07 0745) Pulse Rate: 75  (01/07 0745)  Labs:  Alvira Philips 01/02/12 0657 01/01/12 0630 12/31/11 1823 12/31/11 1822 12/31/11 1143 12/31/11 0931 12/31/11 0120 12/31/11 0026  HGB 11.3* 12.7 -- -- -- -- -- --  HCT 33.4* 37.3 -- -- -- -- 45.0 --  PLT 238 269 -- -- -- -- -- 238  APTT -- -- -- -- -- 31 -- --  LABPROT 21.1* 16.0* -- -- -- 14.4 -- --  INR 1.79* 1.25 -- -- -- 1.10 -- --  HEPARINUNFRC 0.45 0.69 0.92* -- -- -- -- --  CREATININE -- -- -- -- -- -- 0.80 --  CKTOTAL -- -- -- 166 155 150 -- --  CKMB -- -- -- 2.8 2.8 2.9 -- --  TROPONINI -- -- -- 0.30* 0.37* <0.30 -- --   Estimated Creatinine Clearance: 41 ml/min (by C-G formula based on Cr of 0.8).   Medications:  Scheduled:    . ciprofloxacin  250 mg Oral BID  . diltiazem  120 mg Oral Daily  . sodium chloride  3 mL Intravenous Q12H  . warfarin  5 mg Oral ONCE-1800  . DISCONTD: aspirin EC  81 mg Oral Daily  . DISCONTD: diltiazem  120 mg Oral Daily  . DISCONTD: diltiazem  30 mg Oral Q6H  . DISCONTD: sodium chloride  3 mL Intravenous Q12H   Infusions:    . sodium chloride 10 mL/hr at 01/01/12 2054  . diltiazem (CARDIZEM) infusion 15 mg/hr (01/01/12 1855)  . heparin 750 Units/hr (01/01/12 1333)    Assessment: 78 yo F on heparin --> coumadin for new afib.  Heparin level therapeutic at current rate.  INR rising toward goal.  Noted pt continues on Cipro through 01/03/12 which can increase INR.   Goal of Therapy:  INR 2-3 Heparin level 0.3-0.7 units/ml   Plan:  Continue heparin at 750 units/hr. Coumadin 2.5mg  PO x 1 tonight (reduce dose 2/2 big increase in INR) Daily INR, CBC,  and heparin level.  [employer], Pharm.D., BCPS Clinical Pharmacist Pager [phone]  01/02/2012,11:00 AM

## 2012-01-03 LAB — HEPARIN LEVEL (UNFRACTIONATED): Heparin Unfractionated: 0.37 IU/mL (ref 0.30–0.70)

## 2012-01-03 LAB — PROTIME-INR
INR: 2.05 — ABNORMAL HIGH (ref 0.00–1.49)
Prothrombin Time: 23.5 seconds — ABNORMAL HIGH (ref 11.6–15.2)

## 2012-01-03 LAB — CBC
Hemoglobin: 11.8 g/dL — ABNORMAL LOW (ref 12.0–15.0)
RBC: 3.81 MIL/uL — ABNORMAL LOW (ref 3.87–5.11)

## 2012-01-03 MED ORDER — WARFARIN SODIUM 2.5 MG PO TABS: 2.5000 mg | ORAL_TABLET | Freq: Once | ORAL | Status: DC

## 2012-01-03 MED ORDER — DILTIAZEM HCL ER COATED BEADS 120 MG PO CP24: 120.0000 mg | ORAL_CAPSULE | Freq: Every day | ORAL | Status: DC

## 2012-01-03 MED ORDER — WARFARIN SODIUM 2.5 MG PO TABS
2.5000 mg | ORAL_TABLET | Freq: Once | ORAL | Status: DC
  Filled 2012-01-03: qty 1

## 2012-01-03 NOTE — Discharge Summary (Signed)
Patient ID: [name] Daniels,  MRN: [id number], DOB/AGE: [birth year] 76 y.o.  Admit date: 12/30/2011 Discharge date: 01/03/2012  Primary Care Provider: Mauricio Po, NP Primary Cardiologist: Odessa Fleming  Discharge Diagnoses Active Problems:  Atrial fibrillation  UTI (lower urinary tract infection)  Stroke, lacunar-identified by CT scanning   Allergies No Known Allergies  Procedures  01/01/2012 - 2D Echo Study Conclusions  - Left ventricle: The cavity size was normal. Wall thickness was normal. The estimated ejection fraction was 60%. Wall motion was normal; there were no regional wall motion abnormalities. - Right atrium: The atrium was mildly dilated.  History of Present Illness  78 y/o female w/o prior cardiac hx who was in her usoh until approx 2 days prior to admission when she began to experience general malaise with nausea.  She presented to the ED on 1/5 and was found to be in a.fib with RVR with rates in the 120's-170's.  She was placed on heparin and diltiazem infusions and admitted for further evaluation.  Hospital Course    Following admission, pt was found to have a very mild elevation in her troponin to a peak of 0.37 (<.30 being normal).  Her CK's and MB's were wnl and she had no complaints of c/p or dyspnea.  Her EKG was non-acute.  On IV diltiazem, pt converted to sinus rhythm and given a Chads2 score of 3 (Age and prior CVA), we have initiated coumadin therapy.  Pt was incidentally found to have a UTI and has been treated with a 3 day course of Cipro.  In the setting of Cipro therapy, her INR did rise rather abruptly and she is therapeutic today.  We plan to d/c her home today in good condition with a plan for f/u INR this Friday, 1/11 with her PCP (Rx provided).  Because she has had mild elevation of her troponin during admission, we have also arranged for a f/u outpt lexiscan myoview stress test on 1/15.  Discharge Vitals:  Blood pressure 117/62, pulse 75, temperature 98.2 F  (36.8 C), temperature source Oral, resp. rate 18, height 5' (1.524 m), weight 124 lb 1.9 oz (56.3 kg), SpO2 95.00%.   Labs: CBC:  Basename 01/03/12 0600 01/02/12 0657  WBC 8.3 9.1  NEUTROABS -- --  HGB 11.8* 11.3*  HCT 34.8* 33.4*  MCV 91.3 91.0  PLT 243 238   BMET    Component Value Date/Time   NA 138 12/31/2011 0120   K 3.7 12/31/2011 0120   CL 100 12/31/2011 0120   GLUCOSE 119* 12/31/2011 0120   BUN 7 12/31/2011 0120   CREATININE 0.80 12/31/2011 0120     Cardiac Enzymes:  Basename 12/31/11 1822 12/31/11 1143  CKTOTAL 166 155  CKMB 2.8 2.8  CKMBINDEX -- --  TROPONINI 0.30* 0.37*   Fasting Lipid Panel:  Basename 01/01/12 0630  CHOL 203*  HDL 75  LDLCALC 110*  TRIG 88  CHOLHDL 2.7  LDLDIRECT --   Lab Results  Component Value Date   TSH 2.698 12/31/2011    Disposition:  Follow-up Information    Follow up with Mauricio Po F on 01/06/2012. (For INR (Coumadin Level))    Contact information:   [address] [place] [address] [phone]       Follow up with Tereso Newcomer, PA on 01/26/2012. (9:30am - Dr. Odessa Fleming PA)    Contact information:   [address] [place] [address] [phone]       Follow up with [facility] on 01/10/2012. (Pharmacologic Stress Test @ 9:30am)    Contact information:   [address] [place] [address] [phone]         Discharge Medications: Current Discharge Medication List    START taking these medications   Details  diltiazem (CARDIZEM CD) 120 MG 24 hr capsule Take 1 capsule (120 mg total) by mouth daily. Qty: 30 capsule, Refills: 6    warfarin (COUMADIN) 2.5 MG tablet Take 1 tablet (2.5 mg total) by mouth one time only at 6 PM. Qty: 30 tablet, Refills: 2      CONTINUE these medications which have NOT CHANGED   Details  aspirin EC 81 MG tablet Take 81 mg by mouth daily.        STOP taking these medications     Phenyleph-Doxylamine-DM-APAP (ALKA-SELTZER  PLS ALLERGY & CGH) 5-6.25-10-325 MG CAPS         Outstanding Labs/Studies  F/u INR on 1/11 with pcp  Duration of Discharge Encounter: Greater than 30 minutes including physician time.  Signed, Nicolasa Ducking NP 01/03/2012, 10:53 AM

## 2012-01-03 NOTE — Progress Notes (Signed)
ANTICOAGULATION CONSULT NOTE - Follow Up Consult  Pharmacy Consult for Heparin/Coumadin  Indication: atrial fibrillation  No Known Allergies  Patient Measurements: Height: 5' (152.4 cm) Weight: 124 lb 1.9 oz (56.3 kg) IBW/kg (Calculated) : 45.5   Vital Signs: Temp: 98.2 F (36.8 C) (01/08 0500) BP: 117/62 mmHg (01/08 0500) Pulse Rate: 75  (01/08 0500)  Labs:  Basename 01/03/12 0600 01/02/12 0657 01/01/12 0630 12/31/11 1822 12/31/11 1143 12/31/11 0931  HGB 11.8* 11.3* -- -- -- --  HCT 34.8* 33.4* 37.3 -- -- --  PLT 243 238 269 -- -- --  APTT -- -- -- -- -- 31  LABPROT 23.5* 21.1* 16.0* -- -- --  INR 2.05* 1.79* 1.25 -- -- --  HEPARINUNFRC 0.37 0.45 0.69 -- -- --  CREATININE -- -- -- -- -- --  CKTOTAL -- -- -- 166 155 150  CKMB -- -- -- 2.8 2.8 2.9  TROPONINI -- -- -- 0.30* 0.37* <0.30   Estimated Creatinine Clearance: 44.8 ml/min (by C-G formula based on Cr of 0.8).   Medications:  Scheduled:    . ciprofloxacin  250 mg Oral BID  . diltiazem  120 mg Oral Daily  . sodium chloride  3 mL Intravenous Q12H  . warfarin  2.5 mg Oral ONCE-1800  . DISCONTD: aspirin EC  81 mg Oral Daily  . DISCONTD: diltiazem  30 mg Oral Q6H    Assessment: 78 yo F on heparin bridge to coumadin for new afib. Heparin level therapeutic at current rate. INR therapeutic. Noted pt was on a 3 day course of Cipro through 01/02/12 which can increase INR.   Goal of Therapy:  INR 2-3  Heparin level 0.3-0.7 units/ml   Plan:  1. D/C Heparin infusion 2. Coumadin 2.5mg  today, if d/c home, recommend 2.5mg  daily and f/u INR outpatient later this week  Riki Rusk 01/03/2012,9:12 AM

## 2012-01-03 NOTE — Discharge Summary (Signed)
See progress notes Brian Crenshaw  

## 2012-01-03 NOTE — Progress Notes (Signed)
@   Subjective:  Denies CP or dyspnea   Objective:  Filed Vitals:   01/02/12 1657 01/02/12 1900 01/02/12 2200 01/03/12 0500  BP: 130/55 155/67 115/66 117/62  Pulse: 79 85 72 75  Temp: 98.6 F (37 C) 98 F (36.7 C) 98.7 F (37.1 C) 98.2 F (36.8 C)  TempSrc: Oral     Resp:  18  18  Height:      Weight:    124 lb 1.9 oz (56.3 kg)  SpO2: 97% 97% 97% 95%    Intake/Output from previous day:  Intake/Output Summary (Last 24 hours) at 01/03/12 0644 Last data filed at 01/02/12 2242  Gross per 24 hour  Intake    708 ml  Output    475 ml  Net    233 ml    Physical Exam: Physical exam: Well-developed well-nourished in no acute distress.  Skin is warm and dry.  HEENT is normal.  Neck is supple. No thyromegaly.  Chest is clear to auscultation with normal expansion.  Cardiovascular exam is regular rate and rhythm.  Abdominal exam nontender or distended. No masses palpated. Extremities show no edema. neuro grossly intact    Lab Results: Basic Metabolic Panel: No results found for this basename: NA:2,K:2,CL:2,CO2:2,GLUCOSE:2,BUN:2,CREATININE:2,CALCIUM:2,MG:2,PHOS:2 in the last 72 hours CBC:  Basename 01/03/12 0600 01/02/12 0657  WBC 8.3 9.1  NEUTROABS -- --  HGB 11.8* 11.3*  HCT 34.8* 33.4*  MCV 91.3 91.0  PLT 243 238   Cardiac Enzymes:  Basename 12/31/11 1822 12/31/11 1143 12/31/11 0931  CKTOTAL 166 155 150  CKMB 2.8 2.8 2.9  CKMBINDEX -- -- --  TROPONINI 0.30* 0.37* <0.30     Assessment/Plan:  1) PAF - patient remains in NSR; LV function normal; TSH normal; continue cardizem; continue heparin and coumadin. Given prior CVAs noted on head CT, favor continuing heparin until INR > 2. DC later today in INR therapeutic on 2.5 mg daily; check INR in [place] (Dr Elyn Peers) Friday if arrangements can be made; otherwise check in coumadin clinic until she can establish in [place] with Dr Elyn Peers. 2) Elevated troponin - most likely from atrial fibrillation; outpatient myoview for  risk stratification. 3) possible UTI - complete ciprofloxacin 4) Elevated hga1c fu with her primary care FU Dr [name] after discharge and myoview >30 min PA and physician time D2  [name] Daniels 01/03/2012, 6:44 AM

## 2012-01-09 ENCOUNTER — Other Ambulatory Visit (HOSPITAL_COMMUNITY): Payer: Self-pay | Admitting: Internal Medicine

## 2012-01-09 DIAGNOSIS — R079 Chest pain, unspecified: Secondary | ICD-10-CM

## 2012-01-10 ENCOUNTER — Ambulatory Visit (HOSPITAL_COMMUNITY): Payer: Medicare Other | Attending: Cardiology | Admitting: Radiology

## 2012-01-10 VITALS — BP 141/55 | Ht 62.0 in | Wt 119.0 lb

## 2012-01-10 DIAGNOSIS — R5383 Other fatigue: Secondary | ICD-10-CM | POA: Insufficient documentation

## 2012-01-10 DIAGNOSIS — R5381 Other malaise: Secondary | ICD-10-CM | POA: Insufficient documentation

## 2012-01-10 DIAGNOSIS — R0602 Shortness of breath: Secondary | ICD-10-CM

## 2012-01-10 DIAGNOSIS — R079 Chest pain, unspecified: Secondary | ICD-10-CM | POA: Insufficient documentation

## 2012-01-10 DIAGNOSIS — R002 Palpitations: Secondary | ICD-10-CM | POA: Insufficient documentation

## 2012-01-10 DIAGNOSIS — I4891 Unspecified atrial fibrillation: Secondary | ICD-10-CM | POA: Insufficient documentation

## 2012-01-10 MED ORDER — REGADENOSON 0.4 MG/5ML IV SOLN
0.4000 mg | Freq: Once | INTRAVENOUS | Status: AC
  Administered 2012-01-10: 0.4 mg via INTRAVENOUS

## 2012-01-10 MED ORDER — TECHNETIUM TC 99M TETROFOSMIN IV KIT
33.0000 | PACK | Freq: Once | INTRAVENOUS | Status: AC | PRN
  Administered 2012-01-10: 33 via INTRAVENOUS

## 2012-01-10 MED ORDER — TECHNETIUM TC 99M TETROFOSMIN IV KIT
11.0000 | PACK | Freq: Once | INTRAVENOUS | Status: AC | PRN
  Administered 2012-01-10: 11 via INTRAVENOUS

## 2012-01-10 NOTE — Progress Notes (Signed)
[facility] SITE 3 NUCLEAR MED [address] [place] [address] [phone]  Cardiology Nuclear Med Study  [name] Daniels is a 78 y.o. female [id number] 10/27/1930   Nuclear Med Background Indication for Stress Test:  Evaluation for Ischemia and Post Hospital on 01/03/12 New Atrial Fibrillation with RVR History:  01/01/12 Echo:EF=60% Cardiac Risk Factors: CVA: visualized on CT only  Symptoms:  Fatigue, Palpitations and SOB   Nuclear Pre-Procedure Caffeine/Decaff Intake:  None NPO After: 7:00pm   Lungs:  Clear.  O2 SAT 96% on RA. IV 0.9% NS with Angio Cath:  22g  IV Site: R Antecubital  IV Started by:  Milana Na, EMT-P  Chest Size (in):  34 Cup Size: B  Height: 5\' 2"  (1.575 m)  Weight:  119 lb (53.978 kg)  BMI:  Body mass index is 21.77 kg/(m^2). Tech Comments: n/a    Nuclear Med Study 1 or 2 day study: 1 day  Stress Test Type:  Lexiscan  Reading MD: Charlton Haws, MD  Order Authorizing Provider:  Berton Mount, MD  Resting Radionuclide: Technetium 55m Tetrofosmin  Resting Radionuclide Dose: 11.0 mCi   Stress Radionuclide:  Technetium 57m Tetrofosmin  Stress Radionuclide Dose: 33.0 mCi           Stress Protocol Rest HR: 59 Stress HR: 81  Rest BP: 141/55 Stress BP: 153/54  Exercise Time (min): n/a METS: n/a   Predicted Max HR: 141 bpm % Max HR: 57.45 bpm Rate Pressure Product: 21308   Dose of Adenosine (mg):  n/a Dose of Lexiscan: 0.4 mg  Dose of Atropine (mg): n/a Dose of Dobutamine: n/a mcg/kg/min (at max HR)  Stress Test Technologist: Smiley Houseman, CMA-N  Nuclear Technologist:  Doyne Keel, CNMT     Rest Procedure:  Myocardial perfusion imaging was performed at rest 45 minutes following the intravenous administration of Technetium 54m Tetrofosmin.  Rest ECG: Sinus bradycardia and occasional PAC.  Stress Procedure:  The patient received IV Lexiscan 0.4 mg over 15-seconds.  Technetium 85m Tetrofosmin injected at 30-seconds.  There were  nonspecific ST-T wave changes and rare PAC with Lexiscan.  Quantitative spect images were obtained after a 45 minute delay.  Stress ECG: No significant change from baseline ECG  QPS Raw Data Images:  Normal; no motion artifact; normal heart/lung ratio. Stress Images:  Normal homogeneous uptake in all areas of the myocardium. Rest Images:  Normal homogeneous uptake in all areas of the myocardium. Subtraction (SDS):  Normal Transient Ischemic Dilatation (Normal <1.22):  0.88 Lung/Heart Ratio (Normal <0.45):  0.32  Quantitative Gated Spect Images QGS EDV:  35 ml QGS ESV:  7 ml QGS cine images:  NL LV Function; NL Wall Motion QGS EF: 79%  Impression Exercise Capacity:  Lexiscan with no exercise. BP Response:  Normal blood pressure response. Clinical Symptoms:  No chest pain. ECG Impression:  No significant ST segment change suggestive of ischemia. Comparison with Prior Nuclear Study: No previous nuclear study performed  Overall Impression:  Normal stress nuclear study.   Charlton Haws

## 2012-01-26 ENCOUNTER — Encounter: Payer: Medicare Other | Admitting: Physician Assistant

## 2012-01-30 ENCOUNTER — Encounter: Payer: Self-pay | Admitting: Physician Assistant

## 2012-01-30 ENCOUNTER — Ambulatory Visit (INDEPENDENT_AMBULATORY_CARE_PROVIDER_SITE_OTHER): Payer: Medicare Other | Admitting: Physician Assistant

## 2012-01-30 VITALS — BP 138/88 | HR 85 | Ht 59.0 in | Wt 121.0 lb

## 2012-01-30 DIAGNOSIS — I4891 Unspecified atrial fibrillation: Secondary | ICD-10-CM

## 2012-01-30 NOTE — Patient Instructions (Signed)
Your physician has recommended you make the following change in your medication: Stop Aspirin  Your physician wants you to follow-up in: 3 months with Dr [name].  You will receive a reminder letter in the mail two months in advance. If you don't receive a letter, please call our office to schedule the follow-up appointment.

## 2012-01-30 NOTE — Progress Notes (Signed)
  [address] [place], [place]  [address] Phone: [phone] Fax:  [phone]  Date:  01/30/2012   Name:  [name] Daniels       DOB:  January 23, 1930 MRN:  [id number]  PCP:  [name] Po, NP Primary Cardiologist:  Dr. Sherryl Manges  Primary Electrophysiologist:  Dr. Sherryl Manges    History of Present Illness: [name] Daniels is a 78 y.o. female who presents for post hospital follow up.  She was admitted 1/4-1/8 with new onset atrial fibrillation with rapid ventricular rate.  CHADS2 score is 41 (age and prior stroke noted on CT scan).  Echo 01/01/12: Normal wall thickness, EF 60%, normal wall motion, mild RAE.  She was placed on Coumadin.  Troponin was mildly elevated at 0.37.  She converted to sinus rhythm on IV diltiazem.  She was treated for a UTI.  It was felt that her abnormal troponin was probably demand ischemia.  She underwent an outpatient Myoview 01/10/12: No ischemia, EF 79% (low risk).  Her INR is to be followed by her PCP.  Labs: Hemoglobin 11.8, potassium 3.7, creatinine 0.8, total cholesterol 203, HDL 75, LDL 110, TG 88, TSH 2.698.  The patient denies chest pain, shortness of breath, syncope, orthopnea, PND or significant pedal edema.  She is getting INR checked q 2 weeks with her PCP.    Past Medical History  Diagnosis Date  . Atrial fibrillation   . Shortness of breath   . GERD (gastroesophageal reflux disease)   . Stroke, lacunar-identified by CT scanning 01/01/2012    Current Outpatient Prescriptions  Medication Sig Dispense Refill  . aspirin EC 81 MG tablet Take 81 mg by mouth daily.        [name] diltiazem (CARDIZEM CD) 120 MG 24 hr capsule Take 1 capsule (120 mg total) by mouth daily.  30 capsule  6  . warfarin (COUMADIN) 2.5 MG tablet Take 1 tablet (2.5 mg total) by mouth one time only at 6 PM.  30 tablet  2    Allergies: No Known Allergies   History  Substance Use Topics  . Smoking status: Never Smoker   . Smokeless tobacco: Not on file  . Alcohol  Use: No     PHYSICAL EXAM: VS:  BP 138/88  Pulse 85  Ht 4\' 11"  (1.499 m)  Wt 121 lb (54.885 kg)  BMI 24.44 kg/m2 Well nourished, well developed, in no acute distress HEENT: normal Neck: no JVD Vascular: No carotid bruits Endocrine: No thyromegaly Cardiac:  normal S1, S2; RRR; no murmur Lungs:  clear to auscultation bilaterally, no wheezing, rhonchi or rales Abd: soft, nontender, no hepatomegaly Ext: no edema Skin: warm and dry Neuro:  CNs 2-12 intact, no focal abnormalities noted  EKG:  Sinus rhythm, heart rate 85, normal axis, Nonspecific ST-T wave abnormality, no change from prior tracing  ASSESSMENT AND PLAN:

## 2012-01-30 NOTE — Assessment & Plan Note (Signed)
Maintaining sinus rhythm.  Continue Cardizem and Coumadin.  Coumadin followed by PCP.  She can discontinue aspirin.  Mild troponin elevation likely related to demand ischemia in the setting of atrial fibrillation with rapid ventricular rate.  As noted, echocardiogram normal and stress Myoview normal.  Followup with Dr. Sherryl Manges in 3 months.

## 2012-05-08 ENCOUNTER — Ambulatory Visit (INDEPENDENT_AMBULATORY_CARE_PROVIDER_SITE_OTHER): Payer: Medicare Other | Admitting: Internal Medicine

## 2012-05-08 ENCOUNTER — Encounter: Payer: Self-pay | Admitting: Internal Medicine

## 2012-05-08 VITALS — BP 152/70 | HR 73 | Ht 60.0 in | Wt 117.2 lb

## 2012-05-08 DIAGNOSIS — I4891 Unspecified atrial fibrillation: Secondary | ICD-10-CM

## 2012-05-08 DIAGNOSIS — I10 Essential (primary) hypertension: Secondary | ICD-10-CM

## 2012-05-08 MED ORDER — DILTIAZEM HCL ER COATED BEADS 120 MG PO CP24: ORAL_CAPSULE | ORAL | Status: DC

## 2012-05-08 NOTE — Assessment & Plan Note (Signed)
Holding NSR   We discussed coumadin and the NOACs  She will check with pharmacy and let us know re costs and then we will decide  No insurance for meds

## 2012-05-08 NOTE — Progress Notes (Signed)
  HPI  [name] Daniels is a 78 y.o. female Seen in followup for atrial fib with RVR for which she underwent DCCV 1/13 Echo and Myoview   She is treated wth coumadin and dilt  CHADS2>>3  The patient denies chest pain, shortness of breath, nocturnal dyspnea, orthopnea or peripheral edema.  There have been no palpitations, lightheadedness or syncope.  \  Past Medical History  Diagnosis Date  . Atrial fibrillation     Echo 01/01/12: Normal wall thickness, EF 60%, normal wall motion, mild RAE;  Myoview 01/10/12: No ischemia, EF 79% (low risk)  . GERD (gastroesophageal reflux disease)   . Stroke, lacunar-identified by CT scanning 01/01/2012    No past surgical history on file.  Current Outpatient Prescriptions  Medication Sig Dispense Refill  . diltiazem (CARDIZEM CD) 120 MG 24 hr capsule Take 1 capsule (120 mg total) by mouth daily.  30 capsule  6  . warfarin (COUMADIN) 2.5 MG tablet Take 1 tablet (2.5 mg total) by mouth one time only at 6 PM.  30 tablet  2    No Known Allergies  Review of Systems negative except from HPI and PMH  Physical Exam BP 152/70  Pulse 73  Ht 5' (1.524 m)  Wt 117 lb 4 oz (53.184 kg)  BMI 22.90 kg/m2 Well developed and well nourished in no acute distress HENT normal E scleral and icterus clear Neck Supple JVP flat; carotids brisk and full Clear to ausculation Regular rate and rhythm, no murmurs gallops or rub Soft with active bowel sounds No clubbing cyanosis none Edema Alert and oriented, grossly normal motor and sensory function Skin Warm and Dry  ECG  NSR 73 Intervals 14/.07/40 occ PACs nstt   Assessment and  Plan

## 2012-05-08 NOTE — Assessment & Plan Note (Signed)
Elevated and has been consistently at PCP  Will increase Dilt>240 daily   She will look at costs and we will defer to PCP unltimate choice

## 2012-05-08 NOTE — Patient Instructions (Signed)
Your physician has recommended you make the following change in your medication:  1) Increase diltiazem to 120 mg one capsule by mouth twice daily.- talk with Rene Kocher about this this week. It may be that this can be replaced with something less expensive.  Your physician wants you to follow-up in: 6 months with Dr. [name]. You will receive a reminder letter in the mail two months in advance. If you don't receive a letter, please call our office to schedule the follow-up appointment.

## 2012-11-19 ENCOUNTER — Encounter: Payer: Self-pay | Admitting: Internal Medicine

## 2012-11-19 ENCOUNTER — Ambulatory Visit (INDEPENDENT_AMBULATORY_CARE_PROVIDER_SITE_OTHER): Payer: Medicare Other | Admitting: Internal Medicine

## 2012-11-19 VITALS — BP 134/62 | HR 76 | Ht 59.0 in | Wt 116.0 lb

## 2012-11-19 DIAGNOSIS — I4891 Unspecified atrial fibrillation: Secondary | ICD-10-CM

## 2012-11-19 NOTE — Assessment & Plan Note (Signed)
No symptomatic recurrences; on warfarin

## 2012-11-19 NOTE — Progress Notes (Signed)
Patient Care Team: Dema Severin, NP as PCP - General   HPI  [name] Daniels is a 78 y.o. female  Seen in followup for atrial fibrillation with a rapid ventricular response. She is status post cardioversion 1/13. Echo and Myoview at that time were essentially normal  The patient denies chest pain, shortness of breath, nocturnal dyspnea, orthopnea or peripheral edema.  There have been no palpitations, lightheadedness or syncope.  She has remained on Coumadin and is tolerated it well being followed in [place]  Past Medical History  Diagnosis Date  . Atrial fibrillation     Echo 01/01/12: Normal wall thickness, EF 60%, normal wall motion, mild RAE;  Myoview 01/10/12: No ischemia, EF 79% (low risk)  . GERD (gastroesophageal reflux disease)   . Stroke, lacunar-identified by CT scanning 01/01/2012    No past surgical history on file.  Current Outpatient Prescriptions  Medication Sig Dispense Refill  . diltiazem (CARDIZEM CD) 180 MG 24 hr capsule Take 180 mg by mouth daily.      [name] lisinopril (PRINIVIL,ZESTRIL) 10 MG tablet Take 10 mg by mouth daily.      [name] warfarin (COUMADIN) 2.5 MG tablet Take 1 tablet (2.5 mg total) by mouth one time only at 6 PM.  30 tablet  2    No Known Allergies  Review of Systems negative except from HPI and PMH  Physical Exam BP 134/62  Pulse 76  Ht 4\' 11"  (1.499 m)  Wt 116 lb (52.617 kg)  BMI 23.43 kg/m2 Well developed and nourished in no acute distress HENT normal Neck supple with JVP-flat Carotids brisk and full without bruits Clear Regular rate and rhythm, no murmurs or gallops Abd-soft with active BS without hepatomegaly No Clubbing cyanosis edema Skin-warm and dry A & Oriented  Grossly normal sensory and motor function  An electrocardiogram demonstrates sinus rhythm with low amplitude P waves and some PACs rate 74 Intervals 15/08/41 Access 49 of these   Assessment and  Plan

## 2013-06-25 DIAGNOSIS — J189 Pneumonia, unspecified organism: Secondary | ICD-10-CM

## 2013-06-25 HISTORY — DX: Pneumonia, unspecified organism: J18.9

## 2013-07-01 ENCOUNTER — Encounter: Payer: Self-pay | Admitting: Internal Medicine

## 2013-07-01 ENCOUNTER — Encounter: Payer: Self-pay | Admitting: *Deleted

## 2013-07-02 ENCOUNTER — Encounter: Payer: Self-pay | Admitting: Internal Medicine

## 2013-07-02 ENCOUNTER — Ambulatory Visit (INDEPENDENT_AMBULATORY_CARE_PROVIDER_SITE_OTHER): Payer: Medicare Other | Admitting: Internal Medicine

## 2013-07-02 VITALS — BP 121/50 | HR 55 | Ht 59.0 in | Wt 116.0 lb

## 2013-07-02 DIAGNOSIS — I6350 Cerebral infarction due to unspecified occlusion or stenosis of unspecified cerebral artery: Secondary | ICD-10-CM

## 2013-07-02 DIAGNOSIS — I4891 Unspecified atrial fibrillation: Secondary | ICD-10-CM

## 2013-07-02 DIAGNOSIS — I5032 Chronic diastolic (congestive) heart failure: Secondary | ICD-10-CM | POA: Insufficient documentation

## 2013-07-02 NOTE — Assessment & Plan Note (Signed)
Continue Lipitor for secondary prevention for stroke

## 2013-07-02 NOTE — Assessment & Plan Note (Signed)
Currently she is euvolemic. Continue her on her Lasix. Will be important for her to use a scale.

## 2013-07-02 NOTE — Assessment & Plan Note (Addendum)
Paroxysms of atrial fibrillation which are infrequent we think. She was hospitalized with HFpEF a sinus rhythm and underwent a 10 pound diuresis. Home health is   providing a scale.  We will discontinue the homeopathic dose of sotalol. Given the infrequency of her events I will hold off on initiating antiarrhythmic therapy for which I would be inclined to choose flecainide.  We will discontinue niacin  this 79 year old with no known coronary disease

## 2013-07-02 NOTE — Patient Instructions (Addendum)
Your physician wants you to follow-up in: October or November with Dr. [name]. You will receive a reminder letter in the mail two months in advance. If you don't receive a letter, please call our office to schedule the follow-up appointment.  Your physician has recommended you make the following change in your medication:  Stop Levaquin. Stop Niacin. Stop sotalol. Take half coumadin dose tonight. Resume normal dosing tomorrow.  See Rene Kocher for coumadin monitoring appointment on Monday--July 08, 2013.

## 2013-07-02 NOTE — Progress Notes (Signed)
Patient Care Team: Dema Severin, NP as PCP - General   HPI  [name] Daniels is a 79 y.o. female Seen in followup for atrial fibrillation with a rapid ventricular response. She is status post cardioversion 1/13. Echo and Myoview at that time were essentially normal. She is on warfain  She was just hospitalized in the hospital for what appears to be in exacerbation of HFpEF. sHe was treated with IV diuretics with a 10 pound diuresis. sHe was treated with antibiotics.  Hospital records reviewed    She had been seen and hospitalized at [facility] the week before for asymptomatic rapid atrial fibrillation (heart rate 160). Sotalol was added and 80 mg twice daily and was associated with conversion to sinus rhythm. Because of bradycardia later in the week the sotalol dose was decreased from 80--40.  Currently she is feeling pretty well. There is no shortness of breath or edema. She is having some nausea which is attributed to the levaquin  Past Medical History  Diagnosis Date  . Atrial fibrillation     Echo 01/01/12: Normal wall thickness, EF 60%, normal wall motion, mild RAE;  Myoview 01/10/12: No ischemia, EF 79% (low risk)  . GERD (gastroesophageal reflux disease)   . Stroke, lacunar-identified by CT scanning 01/01/2012    No past surgical history on file.  Current Outpatient Prescriptions  Medication Sig Dispense Refill  . diltiazem (CARDIZEM CD) 180 MG 24 hr capsule Take 180 mg by mouth daily.      [name] lisinopril (PRINIVIL,ZESTRIL) 10 MG tablet Take 10 mg by mouth daily.      [name] warfarin (COUMADIN) 2.5 MG tablet Take 1 tablet (2.5 mg total) by mouth one time only at 6 PM.  30 tablet  2   No current facility-administered medications for this visit.    No Known Allergies  Review of Systems negative except from HPI and PMH  Physical Exam BP 121/50  Pulse 55  Ht 4\' 11"  (1.499 m)  Wt 116 lb (52.617 kg)  BMI 23.42 kg/m2 Well developed and nourished in no acute distress HENT normal Neck  supple with JVP-flat Clear Regular rate and rhythm, no murmurs or gallops Abd-soft with active BS No Clubbing cyanosis edema Skin-warm and dry A & Oriented  Grossly normal sensory and motor function Dry    Assessment and  Plan he

## 2013-07-08 HISTORY — PX: CATARACT EXTRACTION W/ INTRAOCULAR LENS IMPLANT: SHX1309

## 2013-07-09 ENCOUNTER — Encounter (HOSPITAL_COMMUNITY): Payer: Self-pay | Admitting: *Deleted

## 2013-07-09 ENCOUNTER — Inpatient Hospital Stay (HOSPITAL_COMMUNITY)
Admission: EM | Admit: 2013-07-09 | Discharge: 2013-07-12 | DRG: 244 | Disposition: A | Discharge status: Home / Self Care | Payer: Medicare Other | Attending: Cardiology | Admitting: Cardiology

## 2013-07-09 ENCOUNTER — Emergency Department (HOSPITAL_COMMUNITY): Payer: Medicare Other

## 2013-07-09 DIAGNOSIS — I495 Sick sinus syndrome: Secondary | ICD-10-CM | POA: Diagnosis present

## 2013-07-09 DIAGNOSIS — Z7901 Long term (current) use of anticoagulants: Secondary | ICD-10-CM

## 2013-07-09 DIAGNOSIS — Z833 Family history of diabetes mellitus: Secondary | ICD-10-CM

## 2013-07-09 DIAGNOSIS — Z602 Problems related to living alone: Secondary | ICD-10-CM

## 2013-07-09 DIAGNOSIS — Z79899 Other long term (current) drug therapy: Secondary | ICD-10-CM

## 2013-07-09 DIAGNOSIS — K219 Gastro-esophageal reflux disease without esophagitis: Secondary | ICD-10-CM | POA: Diagnosis present

## 2013-07-09 DIAGNOSIS — Z8673 Personal history of transient ischemic attack (TIA), and cerebral infarction without residual deficits: Secondary | ICD-10-CM

## 2013-07-09 DIAGNOSIS — Z825 Family history of asthma and other chronic lower respiratory diseases: Secondary | ICD-10-CM

## 2013-07-09 DIAGNOSIS — I4891 Unspecified atrial fibrillation: Principal | ICD-10-CM | POA: Diagnosis present

## 2013-07-09 DIAGNOSIS — N289 Disorder of kidney and ureter, unspecified: Secondary | ICD-10-CM | POA: Diagnosis present

## 2013-07-09 HISTORY — DX: Disorder of kidney and ureter, unspecified: N28.9

## 2013-07-09 HISTORY — DX: Heart failure, unspecified: I50.9

## 2013-07-09 HISTORY — DX: Pneumonia, unspecified organism: J18.9

## 2013-07-09 LAB — CBC WITH DIFFERENTIAL/PLATELET
Basophils Absolute: 0 10*3/uL (ref 0.0–0.1)
Basophils Relative: 0 % (ref 0–1)
Eosinophils Absolute: 0.1 10*3/uL (ref 0.0–0.7)
Eosinophils Relative: 1 % (ref 0–5)
HCT: 34.3 % — ABNORMAL LOW (ref 36.0–46.0)
Hemoglobin: 12.4 g/dL (ref 12.0–15.0)
Lymphocytes Relative: 13 % (ref 12–46)
Lymphs Abs: 1.6 10*3/uL (ref 0.7–4.0)
MCH: 30.6 pg (ref 26.0–34.0)
MCHC: 36.2 g/dL — ABNORMAL HIGH (ref 30.0–36.0)
MCV: 84.7 fL (ref 78.0–100.0)
Monocytes Absolute: 1 10*3/uL (ref 0.1–1.0)
Monocytes Relative: 8 % (ref 3–12)
Neutro Abs: 9.5 10*3/uL — ABNORMAL HIGH (ref 1.7–7.7)
Neutrophils Relative %: 77 % (ref 43–77)
Platelets: 276 10*3/uL (ref 150–400)
RBC: 4.05 MIL/uL (ref 3.87–5.11)
RDW: 15.5 % (ref 11.5–15.5)
WBC: 12.3 10*3/uL — ABNORMAL HIGH (ref 4.0–10.5)

## 2013-07-09 LAB — MRSA PCR SCREENING: MRSA by PCR: NEGATIVE

## 2013-07-09 LAB — COMPREHENSIVE METABOLIC PANEL
ALT: 16 U/L (ref 0–35)
AST: 23 U/L (ref 0–37)
Albumin: 3.2 g/dL — ABNORMAL LOW (ref 3.5–5.2)
Alkaline Phosphatase: 79 U/L (ref 39–117)
Calcium: 8.7 mg/dL (ref 8.4–10.5)
GFR calc Af Amer: 46 mL/min — ABNORMAL LOW (ref >90)
Glucose, Bld: 118 mg/dL — ABNORMAL HIGH (ref 70–99)
Potassium: 4.6 mEq/L (ref 3.5–5.1)
Sodium: 135 mEq/L (ref 135–145)
Total Protein: 7.2 g/dL (ref 6.0–8.3)

## 2013-07-09 LAB — POCT I-STAT TROPONIN I: Troponin i, poc: 0.06 ng/mL (ref 0.00–0.08)

## 2013-07-09 LAB — PROTIME-INR
INR: 2.21 — ABNORMAL HIGH (ref 0.00–1.49)
Prothrombin Time: 23.8 seconds — ABNORMAL HIGH (ref 11.6–15.2)

## 2013-07-09 LAB — PRO B NATRIURETIC PEPTIDE: Pro B Natriuretic peptide (BNP): 649.4 pg/mL — ABNORMAL HIGH (ref 0–450)

## 2013-07-09 MED ORDER — DIFLUPREDNATE 0.05 % OP EMUL: 1.0000 [drp] | Freq: Three times a day (TID) | OPHTHALMIC | Status: DC

## 2013-07-09 MED ORDER — ONDANSETRON HCL 4 MG/2ML IJ SOLN: 4.0000 mg | Freq: Four times a day (QID) | INTRAMUSCULAR | Status: DC | PRN

## 2013-07-09 MED ORDER — DILTIAZEM HCL 100 MG IV SOLR: 15.0000 mg/h | INTRAVENOUS | Status: DC

## 2013-07-09 MED ORDER — SODIUM CHLORIDE 0.9 % IV SOLN
INTRAVENOUS | Status: DC
  Administered 2013-07-09: 14:00:00 via INTRAVENOUS

## 2013-07-09 MED ORDER — DILTIAZEM HCL 25 MG/5ML IV SOLN
15.0000 mg | Freq: Once | INTRAVENOUS | Status: AC
  Administered 2013-07-09: 15 mg via INTRAVENOUS
  Filled 2013-07-09: qty 5

## 2013-07-09 MED ORDER — DILTIAZEM HCL 100 MG IV SOLR
15.0000 mg/h | Freq: Once | INTRAVENOUS | Status: AC
  Administered 2013-07-09: 15 mg/h via INTRAVENOUS

## 2013-07-09 MED ORDER — FUROSEMIDE 40 MG PO TABS
40.0000 mg | ORAL_TABLET | Freq: Two times a day (BID) | ORAL | Status: DC
  Administered 2013-07-09 – 2013-07-12 (×6): 40 mg via ORAL
  Filled 2013-07-09 (×2): qty 1
  Filled 2013-07-09: qty 2
  Filled 2013-07-09 (×7): qty 1

## 2013-07-09 MED ORDER — SODIUM CHLORIDE 0.9 % IV SOLN: 250.0000 mL | INTRAVENOUS | Status: DC | PRN

## 2013-07-09 MED ORDER — ACETAMINOPHEN 325 MG PO TABS
650.0000 mg | ORAL_TABLET | ORAL | Status: DC | PRN
  Administered 2013-07-11: 650 mg via ORAL
  Filled 2013-07-09: qty 2

## 2013-07-09 MED ORDER — POTASSIUM CHLORIDE 20 MEQ PO PACK
20.0000 meq | PACK | Freq: Two times a day (BID) | ORAL | Status: DC
  Filled 2013-07-09 (×2): qty 1

## 2013-07-09 MED ORDER — NEPAFENAC 0.3 % OP SUSP: 1.0000 [drp] | Freq: Every day | OPHTHALMIC | Status: DC

## 2013-07-09 MED ORDER — DILTIAZEM HCL 25 MG/5ML IV SOLN
15.0000 mg | Freq: Once | INTRAVENOUS | Status: AC
  Administered 2013-07-09: 15 mg via INTRAVENOUS

## 2013-07-09 MED ORDER — DILTIAZEM HCL 100 MG IV SOLR
10.0000 mg/h | Freq: Once | INTRAVENOUS | Status: AC
  Administered 2013-07-09: 10 mg/h via INTRAVENOUS

## 2013-07-09 MED ORDER — ATORVASTATIN CALCIUM 20 MG PO TABS
20.0000 mg | ORAL_TABLET | Freq: Every day | ORAL | Status: DC
  Administered 2013-07-09 – 2013-07-11 (×2): 20 mg via ORAL
  Filled 2013-07-09 (×4): qty 1

## 2013-07-09 MED ORDER — DILTIAZEM HCL 100 MG IV SOLR
5.0000 mg/h | Freq: Once | INTRAVENOUS | Status: AC
  Administered 2013-07-09: 5 mg/h via INTRAVENOUS

## 2013-07-09 MED ORDER — ZOLPIDEM TARTRATE 5 MG PO TABS: 5.0000 mg | ORAL_TABLET | Freq: Every evening | ORAL | Status: DC | PRN

## 2013-07-09 MED ORDER — NITROGLYCERIN 0.4 MG SL SUBL: 0.4000 mg | SUBLINGUAL_TABLET | SUBLINGUAL | Status: DC | PRN

## 2013-07-09 MED ORDER — DILTIAZEM HCL 100 MG IV SOLR
5.0000 mg/h | INTRAVENOUS | Status: DC
  Administered 2013-07-10: 10:00:00 10 mg/h via INTRAVENOUS
  Administered 2013-07-11: 5 mg/h via INTRAVENOUS
  Filled 2013-07-09 (×2): qty 100

## 2013-07-09 MED ORDER — WARFARIN SODIUM 3 MG PO TABS
3.0000 mg | ORAL_TABLET | Freq: Once | ORAL | Status: AC
  Administered 2013-07-09: 22:00:00 3 mg via ORAL
  Filled 2013-07-09 (×2): qty 1

## 2013-07-09 MED ORDER — ALPRAZOLAM 0.25 MG PO TABS: 0.2500 mg | ORAL_TABLET | Freq: Two times a day (BID) | ORAL | Status: DC | PRN

## 2013-07-09 MED ORDER — BESIFLOXACIN HCL 0.6 % OP SUSP: 1.0000 [drp] | Freq: Three times a day (TID) | OPHTHALMIC | Status: DC

## 2013-07-09 MED ORDER — WARFARIN - PHARMACIST DOSING INPATIENT: Freq: Every day | Status: DC

## 2013-07-09 MED ORDER — SODIUM CHLORIDE 0.9 % IJ SOLN: 3.0000 mL | INTRAMUSCULAR | Status: DC | PRN

## 2013-07-09 MED ORDER — POTASSIUM CHLORIDE CRYS ER 20 MEQ PO TBCR
20.0000 meq | EXTENDED_RELEASE_TABLET | Freq: Two times a day (BID) | ORAL | Status: DC
  Administered 2013-07-09 – 2013-07-12 (×6): 20 meq via ORAL
  Filled 2013-07-09 (×7): qty 1

## 2013-07-09 MED ORDER — SODIUM CHLORIDE 0.9 % IJ SOLN
3.0000 mL | Freq: Two times a day (BID) | INTRAMUSCULAR | Status: DC
  Administered 2013-07-10 – 2013-07-11 (×3): 3 mL via INTRAVENOUS

## 2013-07-09 NOTE — ED Provider Notes (Signed)
History    CSN: [id number] Arrival date & time 07/09/13  1259  First MD Initiated Contact with Patient 07/09/13 1319     Chief Complaint  Patient presents with  . Chest Pain  . Tachycardia   (Consider location/radiation/quality/duration/timing/severity/associated sxs/prior Treatment) Patient is a 79 y.o. female presenting with chest pain. The history is provided by the patient.  Chest Pain  here complaining of sudden onset of midsternal chest pain nausea vomiting and fast heartbeat. History of A. fib and this is similar. Currently being followed by cardiology and does take Cardizem for this. She notes palpitations and chest pressure. Some diaphoresis and dyspnea. Symptoms have been persistent. No treatment used prior to arrival Past Medical History  Diagnosis Date  . Atrial fibrillation     Echo 01/01/12: Normal wall thickness, EF 60%, normal wall motion, mild RAE;  Myoview 01/10/12: No ischemia, EF 79% (low risk)  . GERD (gastroesophageal reflux disease)   . Stroke, lacunar-identified by CT scanning 01/01/2012   History reviewed. No pertinent past surgical history. History reviewed. No pertinent family history. History  Substance Use Topics  . Smoking status: Never Smoker   . Smokeless tobacco: Not on file  . Alcohol Use: No   OB History   Grav Para Term Preterm Abortions TAB SAB Ect Mult Living                 Review of Systems  Cardiovascular: Positive for chest pain.  All other systems reviewed and are negative.    Allergies  Review of patient's allergies indicates no known allergies.  Home Medications   Current Outpatient Rx  Name  Route  Sig  Dispense  Refill  . atorvastatin (LIPITOR) 20 MG tablet   Oral   Take 20 mg by mouth at bedtime.    30 tablet   11   . Besifloxacin HCl (BESIVANCE) 0.6 % SUSP   Left Eye   Place 1 drop into the left eye 3 (three) times daily.         . Difluprednate (DUREZOL) 0.05 % EMUL   Left Eye   Place 1 drop into the left  eye 3 (three) times daily.         [name] diltiazem (CARDIZEM CD) 180 MG 24 hr capsule   Oral   Take 180 mg by mouth daily.         . furosemide (LASIX) 40 MG tablet   Oral   Take 40 mg by mouth 2 (two) times daily. 1 tab 2 times daily for 30 days.         [name] lisinopril (PRINIVIL,ZESTRIL) 10 MG tablet   Oral   Take 10 mg by mouth daily.         . Nepafenac (ILEVRO) 0.3 % SUSP   Ophthalmic   Apply 1 drop to eye at bedtime.         . potassium chloride (KLOR-CON) 20 MEQ packet   Oral   Take 20 mEq by mouth 2 (two) times daily.         [name] warfarin (COUMADIN) 3 MG tablet   Oral   Take 3 mg by mouth daily. Take Tuesdays, Thursdays, Saturdays, and Sundays         . warfarin (COUMADIN) 4 MG tablet   Oral   Take 4 mg by mouth daily. Take on Mondays, Wednesdays, and Fridays         . levofloxacin (LEVAQUIN) 750 MG tablet   Oral   Take  750 mg by mouth daily.           BP 101/67  Pulse 170  Temp(Src) 97.9 F (36.6 C) (Oral)  Resp 20  SpO2 99% Physical Exam  Nursing note and vitals reviewed. Constitutional: She is oriented to person, place, and time. She appears well-developed and well-nourished.  Non-toxic appearance. No distress.  HENT:  Head: Normocephalic and atraumatic.  Eyes: Conjunctivae, EOM and lids are normal. Pupils are equal, round, and reactive to light.  Neck: Normal range of motion. Neck supple. No tracheal deviation present. No mass present.  Cardiovascular: Normal heart sounds.  An irregularly irregular rhythm present. Tachycardia present.  Exam reveals no gallop.   No murmur heard. Pulmonary/Chest: Effort normal and breath sounds normal. No stridor. No respiratory distress. She has no decreased breath sounds. She has no wheezes. She has no rhonchi. She has no rales.  Abdominal: Soft. Normal appearance and bowel sounds are normal. She exhibits no distension. There is no tenderness. There is no rebound and no CVA tenderness.  Musculoskeletal: Normal  range of motion. She exhibits no edema and no tenderness.  Neurological: She is alert and oriented to person, place, and time. She has normal strength. No cranial nerve deficit or sensory deficit. GCS eye subscore is 4. GCS verbal subscore is 5. GCS motor subscore is 6.  Skin: Skin is warm and dry. No abrasion and no rash noted.  Psychiatric: She has a normal mood and affect. Her speech is normal and behavior is normal.    ED Course  Procedures (including critical care time) Labs Reviewed  CBC WITH DIFFERENTIAL  COMPREHENSIVE METABOLIC PANEL  PROTIME-INR   No results found. No diagnosis found.  MDM   Date: 07/09/2013  Rate: 170  Rhythm: atrial fibrillation  QRS Axis: normal  Intervals: normal  ST/T Wave abnormalities: nonspecific ST changes  Conduction Disutrbances:none  Narrative Interpretation:   Old EKG Reviewed: changes noted, patient now in A. fib with RVR   2:32 PM Patient given Cardizem 15 mg IV push and started on a Cardizem drip at 5 mg per hour. Blood pressure has remained stable. Have spoken with cardiology and patient will be seen by them  Toy Baker, MD 07/09/13 1432

## 2013-07-09 NOTE — ED Notes (Signed)
Consulted MD Wall about HR maintaining 160-170 after appx 1 hr on Diltiazem drip. Orders to follow.

## 2013-07-09 NOTE — ED Notes (Signed)
Contacted Insurance underwriter about wait time for bed. Pt and family made aware of plan.

## 2013-07-09 NOTE — Consult Note (Signed)
CARDIOLOGY CONSULT NOTE   Patient ID: [name] Daniels MRN: [id number] DOB/AGE: [birth year] 79 y.o.  Admit date: 07/09/2013  Primary Physician   [name] Severin, NP Primary Cardiologist   [name] Salvia, MD Reason for Consultation   Chest pain and tachycardia.  [name] Daniels is a 79 y.o. female with a history of atrial fibrillation.    Patient was hospitalized at [name] for asymptomatic a-fib with RVR 2-3 weeks ago. Her cardiologist there was [name] Daniels who discharged patient on Sotalol 80mg  BID which was associated with conversion to sinus rhythm. Because of bradycardia later in the week the sotalol dose was decreased to 40mg . She was then hospitalized again at [name] earlier this month for what she recalls was CHF exacerbation and pneumonia.  Patient followed up with [name] in office on 07/02/13 at which time the Sotalol was discontinued.   Since then patient has felt well until this morning when she experienced sudden onset right "side pain" followed by 2-3 episodes of vomiting and, according to her niece, felt clammy. No hematemesis, abdominal pain, feeling of racing heart, chest pain, SOB, or dizziness. These occurences prompted patient's niece to evaluate HR, BP, and weight. Reportedly, HR at that time was 180 and BP unattainable, which concerned patient's niece and is why they presented to the ED today.   Currently patient feels well and "wants to jump out of bed and go home". She does not feel as if heart is racing, although HR remains in 140-150s. Denies chest pain, SOB, dizziness, nausea, abdominal pain, or arm/neck pain. Denies experiencing jaw pain previously, although states "10 minutes ago right jaw hurt for less than 1 minute" without associated symptoms.   No recent peripheral edema or weight change. Denies increased or altered activities, changes in diet, or recent high-sodium meals. Reports taking all medications as prescribed.   Past Medical History  Diagnosis Date  .  Atrial fibrillation     Echo 01/01/12: Normal [name] Daniels thickness, EF 60%, normal [name] [facility] motion, mild RAE;  Myoview 01/10/12: No ischemia, EF 79% (low risk)  . GERD (gastroesophageal reflux disease)   . Stroke, lacunar-identified by CT scanning 01/01/2012     Past Surgical History  Procedure Laterality Date  . Tonsillectomy      No Known Allergies  I have reviewed the patient's current medications   . sodium chloride 125 mL/hr at 07/09/13 1340     Prior to Admission medications   Medication Sig Start Date End Date Taking? Authorizing Provider  atorvastatin (LIPITOR) 20 MG tablet Take 20 mg by mouth at bedtime.  07/02/13  Yes [name] Salvia, MD  Besifloxacin HCl (BESIVANCE) 0.6 % SUSP Place 1 drop into the left eye 3 (three) times daily.   Yes Historical Provider, MD  Difluprednate (DUREZOL) 0.05 % EMUL Place 1 drop into the left eye 3 (three) times daily.   Yes Historical Provider, MD  diltiazem (CARDIZEM CD) 180 MG 24 hr capsule Take 180 mg by mouth daily.   Yes Historical Provider, MD  furosemide (LASIX) 40 MG tablet Take 40 mg by mouth 2 (two) times daily. 1 tab 2 times daily for 30 days.   Yes Historical Provider, MD  lisinopril (PRINIVIL,ZESTRIL) 10 MG tablet Take 10 mg by mouth daily.   Yes Historical Provider, MD  Nepafenac (ILEVRO) 0.3 % SUSP Apply 1 drop to eye at bedtime.   Yes Historical Provider, MD  potassium chloride (KLOR-CON) 20 MEQ packet Take 20 mEq by mouth 2 (two) times daily.  Yes Historical Provider, MD  warfarin (COUMADIN) 3 MG tablet Take 3 mg by mouth daily. Take Tuesdays, Thursdays, Saturdays, and Sundays   Yes Historical Provider, MD  warfarin (COUMADIN) 4 MG tablet Take 4 mg by mouth daily. Take on Mondays, Wednesdays, and Fridays   Yes Historical Provider, MD  levofloxacin (LEVAQUIN) 750 MG tablet Take 750 mg by mouth daily.  06/28/13   Historical Provider, MD     History   Social History  . Marital Status: Widowed    Spouse Name: N/A    Number of Children: N/A    . Years of Education: N/A   Occupational History  . Retired    Social History Main Topics  . Smoking status: Never Smoker   . Smokeless tobacco: Not on file  . Alcohol Use: No  . Drug Use: No  . Sexually Active: No   Other Topics Concern  . Not on file   Social History Narrative   Pt lives alone. Her brother-in-law visits daily. Her niece and nephew help also.    Family Status  Relation Status Death Age  . Mother Deceased 60s    Parkinson's and CAD  . Father Deceased 20s    Asthma  . Brother Deceased 89s    DM    ROS:  Full 14 point review of systems complete and found to be negative unless listed above.  Physical Exam: Blood pressure 107/36, pulse 158, temperature 97.9 F (36.6 C), temperature source Oral, resp. rate 15, SpO2 96.00%.  General: Well developed, well nourished, female in no acute distress Lungs: No increase in respiratory effort. CTAB.  Heart: Irregularly irregular and tachycardic. No S3, S4, or murmur audible. DP pulses 1+ bilaterally.   Neck: No carotid bruits. JVP approx. 6cm. Extremities: No clubbing or cyanosis. No peripheral edema.  Neuro: Alert and oriented X 3. No focal deficits noted. Psych:  Good affect, responds appropriately  Labs: POC trop i 0.06.    Lab Results  Component Value Date   WBC 12.3* 07/09/2013   HGB 12.4 07/09/2013   HCT 34.3* 07/09/2013   MCV 84.7 07/09/2013   PLT 276 07/09/2013    Recent Labs  07/09/13 1321  INR 2.21*   Lab Results  Component Value Date   CHOL 203* 01/01/2012   HDL 75 01/01/2012   LDLCALC 119* 01/01/2012   TRIG 88 01/01/2012   TSH  Date/Time Value Range Status  12/31/2011  9:31 AM 2.698  0.350 - 4.500 uIU/mL Final   Echo: 06/26/2013 Normal [name] [facility] thickness, EF 60%, normal [name] motion, mild MR and mitral annular calcification, mild-to-mod TR.  Myoview: 01/10/12 No ischemia, EF 79% (low risk)   ECG: 07/09/2013 13:09 Atrial fibrillation with RVR; ventricular rate 160 bpm. ST elevation AVR. ST depression  seen in leads I, II, III, AVF, V3-V6.   Radiology:  Dg Chest Port 1 View 07/09/2013   *RADIOLOGY REPORT*  Clinical Data: Chest pain  PORTABLE CHEST - 1 VIEW  Comparison: 06/28/1939  Findings: The cardiomediastinal silhouette is stable. Atherosclerotic calcifications of the thoracic aorta again noted. Small hiatal hernia. Mild hyperinflation again noted.  IMPRESSION: Mild hyperinflation again noted.  Small hiatal hernia.  No active disease.   Original Report Authenticated By: Natasha Mead, M.D.    ASSESSMENT AND PLAN:   The patient was seen today by Dr Daleen Squibb, the patient evaluated and the data reviewed.   1. Atrial fibrillation with RVR: Patient was initially given Cardizem 15mg  IV bolus and 5mg /hr drip. Blood pressure has remained  stable, but HR persists in 140-150s. At 15:24 another Cardizem 15mg  IV bolus was administered and Cardizem drip was increased to 10mg /hr. Patient remains asymptomatic. Will continue to monitor HR and BP. If rate cannot be controlled medically, will consider cardioversion. May also consider rhythm-controlling medication, likely flecainide as discussed by [name] 07/02/13.  Addendum: pt still in rapid atrial fibrillation despite up-titration and repeated boluses. Will admit and continue current Rx. Have EP see in am.  Signed: Nyra Jabs, PAS Theodore Demark, PA-C 07/09/2013 5:13 PM Beeper [phone] I have taken a history, reviewed medications, allergies, PMH, SH, FH, and reviewed ROS and examined the patient.  I agree with the assessment and plan. Will most likely need Flecainide initiated PTD per Dr [name] Daniels.  Harald Quevedo C. Daleen Squibb, MD, [facility] Pager:  [phone] Co-Sign MD

## 2013-07-09 NOTE — ED Notes (Signed)
Contacted phlebotomy about wait time for blood draw.

## 2013-07-09 NOTE — ED Notes (Signed)
Daughter tearful and upset. Daughter states "I am just really stressed." Explained admission process, re explained diagnosis and plan of care. Daughter states all of her questions are answered, but continuing to cry. Pt resting comfortably in bed. Waiting admission orders.

## 2013-07-09 NOTE — ED Notes (Signed)
Cardiology made aware that pt's HR is maintaining at 130-150 A. Fib

## 2013-07-09 NOTE — Progress Notes (Signed)
ANTICOAGULATION CONSULT NOTE - Initial Consult  Pharmacy Consult for Warfarin Indication: atrial fibrillation  No Known Allergies  Vital Signs: Temp: 97.9 F (36.6 C) (07/15 1306) Temp src: Oral (07/15 1306) BP: 108/70 mmHg (07/15 1800) Pulse Rate: 154 (07/15 1800)  Labs:  Recent Labs  07/09/13 1321  HGB 12.4  HCT 34.3*  PLT 276  LABPROT 23.8*  INR 2.21*  CREATININE 1.25*    Medical History: Past Medical History  Diagnosis Date  . Atrial fibrillation     Echo 01/01/12: Normal wall thickness, EF 60%, normal wall motion, mild RAE;  Myoview 01/10/12: No ischemia, EF 79% (low risk)  . GERD (gastroesophageal reflux disease)   . Stroke, lacunar-identified by CT scanning 01/01/2012    Medications:  Warfarin 4 mg M/W/F, 3 mg all other days  Assessment: 79 y/o F who presents with CP/tachycardia on chronic warfarin therapy for A-fib. INR this evening is 2.21. Hgb 12.4 with no overt bleeding noted. Scr 1.25.   Goal of Therapy:  INR 2-3 Monitor platelets by anticoagulation protocol: Yes   Plan:  -Warfarin 3 mg PO x 1 tonight -Daily PT/INR -Monitor for bleeding  Thank you for allowing me to take part in this patient's care,  Abran Duke, PharmD Clinical Pharmacist Phone: [phone] Pager: [phone] 07/09/2013 6:17 PM

## 2013-07-09 NOTE — ED Notes (Signed)
Per pt and family member, pt having mild cp and episode of n/v today. Checked vitals at home and HR was 180, took pt to dr office and then sent here for further eval. Hx of atrial fib, HR 170 at triage.

## 2013-07-10 ENCOUNTER — Inpatient Hospital Stay (HOSPITAL_COMMUNITY): Payer: Medicare Other

## 2013-07-10 ENCOUNTER — Encounter (HOSPITAL_COMMUNITY)
Admission: EM | Disposition: A | Discharge status: Home / Self Care | Payer: Self-pay | Source: Home / Self Care | Attending: Cardiology

## 2013-07-10 DIAGNOSIS — I495 Sick sinus syndrome: Secondary | ICD-10-CM

## 2013-07-10 HISTORY — PX: PACEMAKER INSERTION: SHX728

## 2013-07-10 HISTORY — PX: PERMANENT PACEMAKER INSERTION: SHX5480

## 2013-07-10 LAB — TROPONIN I
Troponin I: <0.3 ng/mL (ref <0.30)
Troponin I: <0.3 ng/mL (ref <0.30)

## 2013-07-10 LAB — COMPREHENSIVE METABOLIC PANEL
Albumin: 2.8 g/dL — ABNORMAL LOW (ref 3.5–5.2)
Alkaline Phosphatase: 64 U/L (ref 39–117)
BUN: 19 mg/dL (ref 6–23)
Calcium: 8.6 mg/dL (ref 8.4–10.5)
GFR calc Af Amer: 53 mL/min — ABNORMAL LOW (ref >90)
Glucose, Bld: 122 mg/dL — ABNORMAL HIGH (ref 70–99)
Potassium: 4.1 mEq/L (ref 3.5–5.1)
Sodium: 136 mEq/L (ref 135–145)
Total Protein: 6.6 g/dL (ref 6.0–8.3)

## 2013-07-10 LAB — LIPID PANEL
Cholesterol: 138 mg/dL (ref 0–200)
HDL: 53 mg/dL (ref >39)
LDL Cholesterol: 69 mg/dL (ref 0–99)
Triglycerides: 82 mg/dL (ref <150)

## 2013-07-10 LAB — PROTIME-INR: INR: 2.47 — ABNORMAL HIGH (ref 0.00–1.49)

## 2013-07-10 LAB — TSH: TSH: 1.749 u[IU]/mL (ref 0.350–4.500)

## 2013-07-10 SURGERY — PERMANENT PACEMAKER INSERTION
Anesthesia: LOCAL

## 2013-07-10 MED ORDER — OFF THE BEAT BOOK
Freq: Once | Status: AC
  Administered 2013-07-10: 01:00:00
  Filled 2013-07-10: qty 1

## 2013-07-10 MED ORDER — SODIUM CHLORIDE 0.9 % IJ SOLN
3.0000 mL | Freq: Two times a day (BID) | INTRAMUSCULAR | Status: DC
  Administered 2013-07-11: 3 mL via INTRAVENOUS

## 2013-07-10 MED ORDER — LIDOCAINE HCL (PF) 1 % IJ SOLN
INTRAMUSCULAR | Status: AC
  Filled 2013-07-10: qty 60

## 2013-07-10 MED ORDER — CEFAZOLIN SODIUM-DEXTROSE 2-3 GM-% IV SOLR
2.0000 g | INTRAVENOUS | Status: AC
  Administered 2013-07-10: 2 g via INTRAVENOUS

## 2013-07-10 MED ORDER — BESIFLOXACIN HCL 0.6 % OP SUSP
1.0000 [drp] | OPHTHALMIC | Status: DC
  Administered 2013-07-11 – 2013-07-12 (×5): 1 [drp] via OPHTHALMIC

## 2013-07-10 MED ORDER — SODIUM CHLORIDE 0.9 % IV SOLN: INTRAVENOUS | Status: AC

## 2013-07-10 MED ORDER — AMIODARONE HCL IN DEXTROSE 360-4.14 MG/200ML-% IV SOLN
30.0000 mg/h | INTRAVENOUS | Status: DC
  Administered 2013-07-10 – 2013-07-12 (×3): 30 mg/h via INTRAVENOUS
  Filled 2013-07-10 (×9): qty 200

## 2013-07-10 MED ORDER — FENTANYL CITRATE 0.05 MG/ML IJ SOLN
INTRAMUSCULAR | Status: AC
  Filled 2013-07-10: qty 2

## 2013-07-10 MED ORDER — DIFLUPREDNATE 0.05 % OP EMUL
1.0000 [drp] | OPHTHALMIC | Status: DC
  Administered 2013-07-11 – 2013-07-12 (×5): 1 [drp] via OPHTHALMIC

## 2013-07-10 MED ORDER — SODIUM CHLORIDE 0.9 % IV SOLN: 250.0000 mL | INTRAVENOUS | Status: DC

## 2013-07-10 MED ORDER — SODIUM CHLORIDE 0.9 % IJ SOLN: 3.0000 mL | INTRAMUSCULAR | Status: DC | PRN

## 2013-07-10 MED ORDER — SODIUM CHLORIDE 0.9 % IV SOLN: INTRAVENOUS | Status: DC

## 2013-07-10 MED ORDER — CEFAZOLIN SODIUM 1-5 GM-% IV SOLN
INTRAVENOUS | Status: AC
  Filled 2013-07-10: qty 100

## 2013-07-10 MED ORDER — CEFAZOLIN SODIUM 1-5 GM-% IV SOLN
1.0000 g | Freq: Four times a day (QID) | INTRAVENOUS | Status: AC
  Administered 2013-07-10 – 2013-07-11 (×3): 1 g via INTRAVENOUS
  Filled 2013-07-10 (×5): qty 50

## 2013-07-10 MED ORDER — SODIUM CHLORIDE 0.9 % IR SOLN
Freq: Once | Status: DC
  Filled 2013-07-10: qty 2

## 2013-07-10 MED ORDER — NEPAFENAC 0.3 % OP SUSP
1.0000 [drp] | OPHTHALMIC | Status: DC
  Administered 2013-07-10 – 2013-07-11 (×2): 1 [drp] via OPHTHALMIC

## 2013-07-10 MED ORDER — HEPARIN (PORCINE) IN NACL 2-0.9 UNIT/ML-% IJ SOLN
INTRAMUSCULAR | Status: AC
  Filled 2013-07-10: qty 500

## 2013-07-10 MED ORDER — WARFARIN SODIUM 3 MG PO TABS
3.0000 mg | ORAL_TABLET | Freq: Once | ORAL | Status: AC
  Administered 2013-07-10: 3 mg via ORAL
  Filled 2013-07-10 (×2): qty 1

## 2013-07-10 MED ORDER — MIDAZOLAM HCL 5 MG/5ML IJ SOLN
INTRAMUSCULAR | Status: AC
  Filled 2013-07-10: qty 5

## 2013-07-10 MED ORDER — SODIUM CHLORIDE 0.9 % IR SOLN
80.0000 mg | Status: AC
  Administered 2013-07-10: 80 mg

## 2013-07-10 MED ORDER — AMIODARONE HCL IN DEXTROSE 360-4.14 MG/200ML-% IV SOLN
60.0000 mg/h | INTRAVENOUS | Status: AC
  Administered 2013-07-10: 60 mg/h via INTRAVENOUS
  Filled 2013-07-10: qty 200

## 2013-07-10 NOTE — CV Procedure (Signed)
Cx: nausea with vomiting  BP stable, transient dip in HR  flouro of cardiac silhouette normal   Denied pain or shortness of breath

## 2013-07-10 NOTE — Progress Notes (Signed)
Patient: Lizza Huffaker Date of Encounter: 07/10/2013, 8:49 AM Admit date: 07/09/2013     Subjective  Ms. Garin denies any complaints currently. Her CP has resolved. She denies SOB or palpitations. She was symptomatic yesterday with rates >140 bpm.   Objective  Physical Exam: Vitals: BP 95/53  Pulse 126  Temp(Src) 98.1 F (36.7 C) (Oral)  Resp 18  Ht 4\' 11"  (1.499 m)  Wt 117 lb 8.1 oz (53.3 kg)  BMI 23.72 kg/m2  SpO2 98% General: Well developed, well appearing 79 year old female in no acute distress. Neck: Supple. JVD not elevated. Lungs: Clear bilaterally to auscultation without wheezes, rales, or rhonchi. Breathing is unlabored. Heart: Irregular, tachycardic S1 S2 without murmur, rub or gallop.  Abdomen: Soft, non-distended. Extremities: No clubbing or cyanosis. No edema.  Distal pedal pulses are 2+ and equal bilaterally. Neuro: Alert and oriented X 3. Moves all extremities spontaneously. No focal deficits.  Intake/Output:  Intake/Output Summary (Last 24 hours) at 07/10/13 0849 Last data filed at 07/10/13 0700  Gross per 24 hour  Intake  207.5 ml  Output   1700 ml  Net -1492.5 ml    Inpatient Medications:  . atorvastatin  20 mg Oral QHS  . Besifloxacin HCl  1 drop Left Eye TID  . Difluprednate  1 drop Left Eye TID  . furosemide  40 mg Oral BID  . Nepafenac  1 drop Ophthalmic QHS  . potassium chloride  20 mEq Oral BID  . sodium chloride  3 mL Intravenous Q12H  . Warfarin - Pharmacist Dosing Inpatient   Does not apply q1800   . sodium chloride Stopped (07/09/13 1808)  . diltiazem (CARDIZEM) infusion 5 mg/hr (07/10/13 0700)    Labs:  Recent Labs  07/09/13 1321 07/10/13 0521  NA 135 136  K 4.6 4.1  CL 96 98  CO2 23 26  GLUCOSE 118* 122*  BUN 29* 19  CREATININE 1.25* 1.11*  CALCIUM 8.7 8.6    Recent Labs  07/09/13 1321 07/10/13 0521  AST 23 17  ALT 16 12  ALKPHOS 79 64  BILITOT 0.3 0.3  PROT 7.2 6.6  ALBUMIN 3.2* 2.8*    Recent Labs  07/09/13 1321  WBC 12.3*  NEUTROABS 9.5*  HGB 12.4  HCT 34.3*  MCV 84.7  PLT 276    Recent Labs  07/09/13 2305 07/10/13 0521  TROPONINI <0.30 <0.30    Recent Labs  07/10/13 0521  CHOL 138  HDL 53  LDLCALC 69  TRIG 82  CHOLHDL 2.6    Recent Labs  07/09/13 1321  INR 2.21*    Radiology/Studies: Dg Chest Port 1 View  07/09/2013   *RADIOLOGY REPORT*  Clinical Data: Chest pain  PORTABLE CHEST - 1 VIEW  Comparison: 06/28/1939  Findings: The cardiomediastinal silhouette is stable. Atherosclerotic calcifications of the thoracic aorta again noted. Small hiatal hernia. Mild hyperinflation again noted.  IMPRESSION: Mild hyperinflation again noted.  Small hiatal hernia.  No active disease.   Original Report Authenticated By: Natasha Mead, M.D.    Echocardiogram: from [facility] 06/26/2013 by Dr. Tomie China Normal LV systolic function, EF 55-60% Normal RV function Mild MR Mild-moderate TR Normal atria sizes, LA dimension 3.6 cm.   12-lead ECG: rapid AF with inferolateral ST depression Telemetry: persistent AF    Assessment and Plan  1. AF, not adequately rate controlled  - Transfer to stepdown for closer monitoring as medication titration needed frequently  - Symptomatic with rates >140 bpm  - Up-titration  of IV diltiazem limited by hypotension  - Will start IV amiodarone infusion  - With normal LA dimension, suspect AF burden low at this time so will likely benefit from AAD to promote SR - MD to advise  - Continue warfarin for embolic prophylaxis 2. Chest pain  - With rapid AF; inferolateral ST depression with tachycardia   - CEs negative  - Probably needs ischemic eval (h/o negative Lexiscan Myoview Jan 2013) once rate/AF stable  Dr. [name] to see Signed, EDMISTEN, BROOKE PA-C  With recurrent AF RVR and with known sever bradycardia which prompted the discontinuation of most of the rate controlling agents.  Her QT is too long to allow her to take tikosyn, which  leaves as our option on ly reate control,limited by blood pressure using av nodal agents, amio( or dronaderone) which will also slow the HR, or AV ablation  Given the lack of options which will not further aggravate her irreversible sinus node dysfunction we will plan to proceed with pacing followed by amiodarone and then dccv  The benefits and risks were reviewed including but not limited to death,  perforation, infection, lead dislodgement and device malfunction.  The patient understands agrees and is willing to proceed.

## 2013-07-10 NOTE — Progress Notes (Signed)
ANTICOAGULATION CONSULT NOTE - Follow Up Consult  Pharmacy Consult for coumadin Indication: atrial fibrillation  No Known Allergies  Patient Measurements: Height: 4\' 11"  (149.9 cm) Weight: 117 lb 8.1 oz (53.3 kg) IBW/kg (Calculated) : 43.2  Vital Signs: Temp: 98.5 F (36.9 C) (07/16 1230) Temp src: Oral (07/16 0859) BP: 88/57 mmHg (07/16 1230) Pulse Rate: 106 (07/16 1230)  Labs:  Recent Labs  07/09/13 1321 07/09/13 2305 07/10/13 0521 07/10/13 1136 07/10/13 1426  HGB 12.4  --   --   --   --   HCT 34.3*  --   --   --   --   PLT 276  --   --   --   --   LABPROT 23.8*  --   --   --  25.9*  INR 2.21*  --   --   --  2.47*  CREATININE 1.25*  --  1.11*  --   --   TROPONINI  --  <0.30 <0.30 <0.30  --     Estimated Creatinine Clearance: 30.1 ml/min (by C-G formula based on Cr of 1.11).   Medications:  Scheduled:  . atorvastatin  20 mg Oral QHS  . Besifloxacin HCl  1 drop Left Eye Custom  . Difluprednate  1 drop Left Eye Custom  . furosemide  40 mg Oral BID  . Nepafenac  1 drop Ophthalmic Q24H  . potassium chloride  20 mEq Oral BID  . sodium chloride  3 mL Intravenous Q12H  . Warfarin - Pharmacist Dosing Inpatient   Does not apply q1800    Assessment: 79 y/o F who presents with CP/tachycardia on chronic warfarin therapy for A-fib. INR today is 2.47 and at goal. Patient started on IV amiodarone 7/16. Weekly coumadin dose is 24mg  and an adjustment of ~35-50% is suggested in many sources. In many cases the effect of amiodarone on coumadin is delayed (by 1- 2 weeks) some sources indicate INR elevation on day 3-4.  Home coumadin regimen: 3mg  TTSS, 4mg  MWF  Goal of Therapy:  INR 2-3 Monitor platelets by anticoagulation protocol: Yes   Plan:  -Coumadin 3mg  today -Will consider further coumadin reduction based on patient progress -Daily PT/INR  Harland German, Pharm D 07/10/2013 3:09 PM

## 2013-07-10 NOTE — CV Procedure (Signed)
[name] Daniels [id number]  [id number]  Preop Dx: tachybrady with uncontrolled AF RVR and sinus rates<40 Postop Dx same/   Procedure: dual chamber pacemaker insertion  After routine prep and drape, lidocaine was infiltrated in the prepectoral subclavicular region on the left side an incision was made and carried down to layer of  the prepectoral fascia using electrocautery and sharp dissection;  a pocket was formed similarly. Hemostasis was obtained.  After this, we turned our attention to gaining access to the extrathoracic,left subclavian vein. This was accomplished without difficulty and without the aspiration of air or puncture of the artery. 2 separate venipunctures were accomplished; guidewires were placed and retained and sequentially 7 French sheaths  were placed through which were passed a  St Jude  ventricular lead, serial number [id number] and a   St Jude  atrial lead, serial number [id number].  The ventricular lead was manipulated to the right ventricular apex where the bipolar R wave was 18, the pacing impedance was 980, the threshold was  <1 V  @ 0.5 msec .  Current at threshold was   1 ma.  The right atrial lead was manipulated to the right atrial appendage  where the bipolar fib wave was 0.3-1.0 mv, the pacing impedance was 689, the threshold was N/A   The leads were affixed to the prepectoral fascia and attached to a  Medtronic adapta L pulse generator.  Serial number [id number]  Hemostasis was obtained. The pocket was copiously irrigated with antibiotic containing saline solution. The leads and the pulse generator were placed in the pocket and affixed to the prepectoral fascia. The wound iwas then closed in 3 layers in normal fashion. The wound was washed dried and a benzoin Steri-Strip dressing  was applied.  Needle count, sponge count  and instrument counts were correct at the end of the procedure .[name] The patient tolerated the procedure without apparent complication.  Duke Salvia  M.D. 07/10/2013 4:37 PM

## 2013-07-10 NOTE — Care Management Note (Addendum)
    Page 1 of 2   07/12/2013     12:00:35 PM   CARE MANAGEMENT NOTE 07/12/2013  Patient:  [name] Daniels,[name] Daniels   Account Number:  [id number]  Date Initiated:  07/10/2013  Documentation initiated by:  Junius Creamer  Subjective/Objective Assessment:   adm w at fib w rvr     Action/Plan:   lives alone, pcp dr Mauricio Po, act w [facility] for rn   Anticipated DC Date:     Anticipated DC Plan:  HOME W HOME HEALTH SERVICES      DC Planning Services  CM consult      [facility] Choice  Resumption Of Svcs/PTA Provider   Choice offered to / List presented to:          [facility] arranged  HH-1 RN      [facility] agency  [facility]   Status of service:   Medicare Important Message given?   (If response is "NO", the following Medicare IM given date fields will be blank) Date Medicare IM given:   Date Additional Medicare IM given:    Discharge Disposition:  HOME W HOME HEALTH SERVICES  Per UR Regulation:  Reviewed for med. necessity/level of care/duration of stay  If discussed at Long Length of Stay Meetings, dates discussed:    Comments:  07-12-13 11:50am Avie Arenas, RNBSN [phone] patient to be discharged today.  Verified was home with [facility], RN only and would like to return to that.  We need order for resumption of [facility] RN on discharge.  Once written will fax to [facility] - fax number [phone]  7/16 1327p debbie dowell rn,bsn alerted [facility] home health of pt's adm.

## 2013-07-11 ENCOUNTER — Encounter (HOSPITAL_COMMUNITY): Payer: Self-pay | Admitting: Anesthesiology

## 2013-07-11 ENCOUNTER — Encounter (HOSPITAL_COMMUNITY)
Admission: EM | Disposition: A | Discharge status: Home / Self Care | Payer: Self-pay | Source: Home / Self Care | Attending: Cardiology

## 2013-07-11 ENCOUNTER — Encounter (HOSPITAL_COMMUNITY): Payer: Self-pay | Admitting: *Deleted

## 2013-07-11 LAB — BASIC METABOLIC PANEL
BUN: 16 mg/dL (ref 6–23)
CO2: 27 mEq/L (ref 19–32)
Chloride: 94 mEq/L — ABNORMAL LOW (ref 96–112)
Creatinine, Ser: 1.21 mg/dL — ABNORMAL HIGH (ref 0.50–1.10)
GFR calc Af Amer: 48 mL/min — ABNORMAL LOW (ref >90)
Glucose, Bld: 130 mg/dL — ABNORMAL HIGH (ref 70–99)
Potassium: 4.1 mEq/L (ref 3.5–5.1)

## 2013-07-11 SURGERY — CARDIOVERSION
Anesthesia: Monitor Anesthesia Care | Wound class: Clean

## 2013-07-11 MED ORDER — WARFARIN SODIUM 1 MG PO TABS
1.0000 mg | ORAL_TABLET | Freq: Once | ORAL | Status: AC
  Administered 2013-07-11: 1 mg via ORAL
  Filled 2013-07-11: qty 1

## 2013-07-11 NOTE — Progress Notes (Signed)
Nutrition Brief Note  Patient identified on the Malnutrition Screening Tool (MST) Report  Body mass index is 23.01 kg/(m^2). Patient meets criteria for wnl based on current BMI.   Current diet order is NPO s/p cath. Labs and medications reviewed.   Pt's wt is stable PTA.  Family reports small-moderate intake which is usual for pt. She remains NPO s/p cath.  Hopeful for discharge tomorrow.  Discussed diet advancement with RN.   Family at bedside requests Low Sodium information for review.  RD provided with "Low Sodium Nutrition Therapy" handout.  Contact information provided.   If nutrition issues arise, please consult RD.   Loyce Dys, MS RD LDN Clinical Inpatient Dietitian Pager: [phone] Weekend/After hours pager: [phone]

## 2013-07-11 NOTE — Progress Notes (Signed)
ANTICOAGULATION CONSULT NOTE - Follow Up Consult  Pharmacy Consult for Coumadin Indication: Afib  No Known Allergies  Patient Measurements: Height: 4\' 11"  (149.9 cm) Weight: 113 lb 15.7 oz (51.7 kg) IBW/kg (Calculated) : 43.2 Heparin Dosing Weight:   Vital Signs: Temp: 97.3 F (36.3 C) (07/17 0828) Temp src: Oral (07/17 0828) BP: 106/63 mmHg (07/17 1000) Pulse Rate: 140 (07/17 1000)  Labs:  Recent Labs  07/09/13 1321 07/09/13 2305 07/10/13 0521 07/10/13 1136 07/10/13 1426 07/11/13 0920  HGB 12.4  --   --   --   --   --   HCT 34.3*  --   --   --   --   --   PLT 276  --   --   --   --   --   LABPROT 23.8*  --   --   --  25.9* 29.7*  INR 2.21*  --   --   --  2.47* 2.95*  CREATININE 1.25*  --  1.11*  --   --  1.21*  TROPONINI  --  <0.30 <0.30 <0.30  --   --     Estimated Creatinine Clearance: 25.3 ml/min (by C-G formula based on Cr of 1.21).   Assessment: 79 y/o F who presents with CP/tachycardia on chronic warfarin therapy for A-fib.  PMH: afib, GERD, CVA  Anticoagulation: coumadin for afib/CVA. INR up to 2.95 this am (could be secondary to interaction with amiodarone) PTA regimen: 3mg  TTSS, 4mg  MWF (admit INR 2.21)  Cardiovascular: afib w/ RVR, also needs ischemic eval once AF stable.  Meds: amio gtt, kcl, lasix, lipitor, IV diltiazem  Goal of Therapy:  INR 2-3 Monitor platelets by anticoagulation protocol: Yes   Plan:  DCCV Coumadin 1 mg po x 1 this PM   Kodee Drury, [employer] 07/11/2013,11:38 AM

## 2013-07-11 NOTE — Preoperative (Signed)
Beta Blockers   Reason not to administer Beta Blockers:Not Applicable 

## 2013-07-11 NOTE — Progress Notes (Signed)
Patient: [name] Daniels Date of Encounter: 07/11/2013, 7:28 AM Admit date: 07/09/2013     Subjective  Ms. Lawley denies any complaints currently. Her CP has resolved. She denies SOB or palpitations. She was symptomatic yesterday with rates >140 bpm.   Objective  Physical Exam: Vitals: BP 116/58  Pulse 120  Temp(Src) 98 F (36.7 C) (Oral)  Resp 23  Ht 4\' 11"  (1.499 m)  Wt 113 lb 15.7 oz (51.7 kg)  BMI 23.01 kg/m2  SpO2 100% General: Well developed, well appearing 79 year old female in no acute distress. Neck: Supple. JVD not elevated. Lungs: Clear bilaterally to auscultation without wheezes, rales, or rhonchi. Breathing is unlabored. Heart: Irregular, tachycardic S1 S2 without murmur, rub or gallop.  Abdomen: Soft, non-distended. Extremities: No clubbing or cyanosis. No edema.  Distal pedal pulses are 2+ and equal bilaterally. Neuro: Alert and oriented X 3. Moves all extremities spontaneously. No focal deficits. Skin: Left upper chest/implant site intact without significant bleeding or hematoma.  Intake/Output:  Intake/Output Summary (Last 24 hours) at 07/11/13 0728 Last data filed at 07/11/13 0600  Gross per 24 hour  Intake 792.21 ml  Output   1151 ml  Net -358.79 ml    Inpatient Medications:  . atorvastatin  20 mg Oral QHS  . Besifloxacin HCl  1 drop Left Eye Custom  .  ceFAZolin (ANCEF) IV  1 g Intravenous Q6H  . Difluprednate  1 drop Left Eye Custom  . furosemide  40 mg Oral BID  . Nepafenac  1 drop Ophthalmic Q24H  . potassium chloride  20 mEq Oral BID  . sodium chloride  3 mL Intravenous Q12H  . sodium chloride  3 mL Intravenous Q12H  . Warfarin - Pharmacist Dosing Inpatient   Does not apply q1800   . sodium chloride Stopped (07/09/13 1808)  . sodium chloride    . sodium chloride    . amiodarone (NEXTERONE PREMIX) 360 mg/200 mL dextrose 30.06 mg/hr (07/11/13 0600)  . diltiazem (CARDIZEM) infusion 5 mg/hr (07/11/13 0616)    Labs:  Recent Labs  07/09/13 1321 07/10/13 0521  NA 135 136  K 4.6 4.1  CL 96 98  CO2 23 26  GLUCOSE 118* 122*  BUN 29* 19  CREATININE 1.25* 1.11*  CALCIUM 8.7 8.6    Recent Labs  07/09/13 1321 07/10/13 0521  AST 23 17  ALT 16 12  ALKPHOS 79 64  BILITOT 0.3 0.3  PROT 7.2 6.6  ALBUMIN 3.2* 2.8*    Recent Labs  07/09/13 1321  WBC 12.3*  NEUTROABS 9.5*  HGB 12.4  HCT 34.3*  MCV 84.7  PLT 276    Recent Labs  07/09/13 2305 07/10/13 0521 07/10/13 1136  TROPONINI <0.30 <0.30 <0.30    Recent Labs  07/10/13 0521  CHOL 138  HDL 53  LDLCALC 69  TRIG 82  CHOLHDL 2.6    Recent Labs  07/10/13 1426  INR 2.47*    Radiology/Studies: Echocardiogram: [facility]2013 by Dr. Tomie China Normal LV systolic function, EF 55-60% Normal RV function Mild MR Mild-moderate TR Normal atria sizes, LA dimension 3.6 cm.   CXR: pending this AM Device interrogation: performed by industry this AM shows normal PPM function; unable to check atrial threshold as pt in AF  12-lead ECG: rapid AF at 146 bpm, inferolateral ST depression not as prominent today Telemetry: persistent rapid AF    Assessment and Plan  1. AF, not adequately rate controlled  - Keep in stepdown for  closer monitoring as medication titration needed frequently  - Symptomatic with rates >140 bpm although not symptomatic currently while lying in bed  - Up-titration of IV diltiazem limited by hypotension  - IV amiodarone added yesterday   - Will need DCCV; ? TEE-guided - she has been in AF since admission on 07/09/2013; however, duration not fully known as her symptoms are unreliable (symptomatic on admission when rate >160 bpm but now not aware of AF with rates in 140s); INR has been followed by [facility] and will request recent values  - Continue warfarin for embolic prophylaxis 2. Chest pain  - With rapid AF; inferolateral ST depression with tachycardia   - CEs negative  - Probably needs  ischemic eval (h/o negative Lexiscan Myoview Jan 2013) once rate/AF stable  Dr. [name] to see Signed, EDMISTEN, BROOKE PA-C  She was in afib when we saw her in office, she is ok for DCCV today Continue amio post cardioversion with cahnge to PO Hopefully homein am

## 2013-07-12 ENCOUNTER — Encounter (HOSPITAL_COMMUNITY)
Admission: EM | Disposition: A | Discharge status: Home / Self Care | Payer: Self-pay | Source: Home / Self Care | Attending: Cardiology

## 2013-07-12 ENCOUNTER — Other Ambulatory Visit: Payer: Self-pay | Admitting: Physician Assistant

## 2013-07-12 ENCOUNTER — Encounter (HOSPITAL_COMMUNITY): Payer: Self-pay | Admitting: Anesthesiology

## 2013-07-12 DIAGNOSIS — Z9229 Personal history of other drug therapy: Secondary | ICD-10-CM

## 2013-07-12 SURGERY — ECHOCARDIOGRAM, TRANSESOPHAGEAL
Anesthesia: Moderate Sedation

## 2013-07-12 MED ORDER — WARFARIN SODIUM 4 MG PO TABS: ORAL_TABLET | ORAL | Status: DC

## 2013-07-12 MED ORDER — AMIODARONE HCL 200 MG PO TABS
400.0000 mg | ORAL_TABLET | Freq: Two times a day (BID) | ORAL | Status: DC
  Administered 2013-07-12: 400 mg via ORAL
  Filled 2013-07-12 (×2): qty 2

## 2013-07-12 MED ORDER — AMIODARONE HCL 200 MG PO TABS: ORAL_TABLET | ORAL | Status: DC

## 2013-07-12 MED ORDER — FUROSEMIDE 40 MG PO TABS: 40.0000 mg | ORAL_TABLET | Freq: Two times a day (BID) | ORAL | Status: DC

## 2013-07-12 NOTE — Progress Notes (Signed)
Pt BP low while sleeping and asymptomatic. MD notified. No interventions ordered.  Charlane Ferretti, RN

## 2013-07-12 NOTE — Progress Notes (Signed)
Patient: Namiah Dunnavant Date of Encounter: 07/12/2013, 8:21 AM Admit date: 07/09/2013     Subjective  Ms. Tourangeau denies any complaints currently. Her CP has resolved. She denies SOB or palpitations. She was symptomatic yesterday with rates >140 bpm. She has reverted spontaneously to nsr with few pac   Feels much better   Objective  Physical Exam: Vitals: BP 123/41  Pulse 82  Temp(Src) 98.4 F (36.9 C) (Oral)  Resp 14  Ht 4\' 11"  (1.499 m)  Wt 113 lb 8.6 oz (51.5 kg)  BMI 22.92 kg/m2  SpO2 93% General: Well developed, well appearing 79 year old female in no acute distress. Neck: Supple. JVD not elevated. Lungs: Clear bilaterally to auscultation without wheezes, rales, or rhonchi. Breathing is unlabored. Heart: RRR Abdomen: Soft, non-distended. Extremities: No clubbing or cyanosis. No edema.  Distal pedal pulses are 2+ and equal bilaterally. Neuro: Alert and oriented X 3. Moves all extremities spontaneously. No focal deficits. Skin: Left upper chest/implant site intact without significant bleeding or hematoma.  Intake/Output:  Intake/Output Summary (Last 24 hours) at 07/12/13 0821 Last data filed at 07/12/13 0800  Gross per 24 hour  Intake 1205.8 ml  Output    101 ml  Net 1104.8 ml    Inpatient Medications:  . atorvastatin  20 mg Oral QHS  . Besifloxacin HCl  1 drop Left Eye Custom  . Difluprednate  1 drop Left Eye Custom  . furosemide  40 mg Oral BID  . Nepafenac  1 drop Ophthalmic Q24H  . potassium chloride  20 mEq Oral BID  . sodium chloride  3 mL Intravenous Q12H  . sodium chloride  3 mL Intravenous Q12H  . Warfarin - Pharmacist Dosing Inpatient   Does not apply q1800   . sodium chloride Stopped (07/09/13 1808)  . sodium chloride    . sodium chloride    . amiodarone (NEXTERONE PREMIX) 360 mg/200 mL dextrose 30 mg/hr (07/12/13 0742)  . diltiazem (CARDIZEM) infusion Stopped (07/12/13 0100)    Labs:  Recent Labs  07/10/13 0521 07/11/13 0920  NA 136 133*    K 4.1 4.1  CL 98 94*  CO2 26 27  GLUCOSE 122* 130*  BUN 19 16  CREATININE 1.11* 1.21*  CALCIUM 8.6 8.7    Recent Labs  07/09/13 1321 07/10/13 0521  AST 23 17  ALT 16 12  ALKPHOS 79 64  BILITOT 0.3 0.3  PROT 7.2 6.6  ALBUMIN 3.2* 2.8*    Recent Labs  07/09/13 1321  WBC 12.3*  NEUTROABS 9.5*  HGB 12.4  HCT 34.3*  MCV 84.7  PLT 276    Recent Labs  07/09/13 2305 07/10/13 0521 07/10/13 1136  TROPONINI <0.30 <0.30 <0.30    Recent Labs  07/10/13 0521  CHOL 138  HDL 53  LDLCALC 69  TRIG 82  CHOLHDL 2.6    Recent Labs  07/12/13 0415  INR 3.06*    Radiology/Studies: Echocardiogram: from [facility] 06/26/2013 by Dr. Tomie China Normal LV systolic function, EF 55-60% Normal RV function Mild MR Mild-moderate TR Normal atria sizes, LA dimension 3.6 cm.   CXR: pending this AM Device interrogation: performed by industry this AM shows normal PPM function; unable to check atrial threshold as pt in AF  12-lead ECG: rapid AF at 146 bpm, inferolateral ST depression not as prominent today Telemetry: persistent rapid AF    Assessment and Plan  1. AF, >>>NSR Change amio to po 400 bid  X 2 weeks 400 qd x  2 weeks Then 200 mg daily Will need f/u Sk/BE 6 weeks Wound check LHC 10 days PFTs with DLCO at that time D/C off lisinopril Need close followup of INR    2) bradycardia in context of atrial fibrillation with RVR requiring rate controlling agents without reversible component to tachycardia  Sherryl Manges, MD 07/12/2013 8:38 AM

## 2013-07-12 NOTE — Progress Notes (Signed)
ANTICOAGULATION CONSULT NOTE - Follow Up Consult  Pharmacy Consult for Coumadin Indication: Afib  No Known Allergies  Patient Measurements: Height: 4\' 11"  (149.9 cm) Weight: 113 lb 8.6 oz (51.5 kg) IBW/kg (Calculated) : 43.2  Vital Signs: Temp: 98.4 F (36.9 C) (07/18 0734) Temp src: Oral (07/18 0734) BP: 123/41 mmHg (07/18 0734) Pulse Rate: 82 (07/18 0734)  Labs:  Recent Labs  07/09/13 1321 07/09/13 2305 07/10/13 0521 07/10/13 1136 07/10/13 1426 07/11/13 0920 07/12/13 0415  HGB 12.4  --   --   --   --   --   --   HCT 34.3*  --   --   --   --   --   --   PLT 276  --   --   --   --   --   --   LABPROT 23.8*  --   --   --  25.9* 29.7* 30.5*  INR 2.21*  --   --   --  2.47* 2.95* 3.06*  CREATININE 1.25*  --  1.11*  --   --  1.21*  --   TROPONINI  --  <0.30 <0.30 <0.30  --   --   --     Estimated Creatinine Clearance: 25.3 ml/min (by C-G formula based on Cr of 1.21).   Assessment: 79 y/o F who presents with CP/tachycardia on chronic warfarin therapy for A-fib. INR today is 3.06 and daily trend up noted since 7/15. Patient started on IV amiodarone 7/16 and now changed to po. Weekly coumadin dose is 24mg  and an adjustment of ~35-50% is suggested in many sources. Patient converted to NSR today.  Home coumadin regimen: 3mg  TTSS, 4mg  MWF  Goal of Therapy:  INR 2-3 Monitor platelets by anticoagulation protocol: Yes   Plan:  -Hold coumadin today -With amiodarone addition once coumadin is restarted would consider a coumadin dose of 2mg /day (a 40% reduction from home regimen) -Daily INR while admitted  Harland German, Pharm D 07/12/2013 10:39 AM

## 2013-07-12 NOTE — Discharge Summary (Signed)
Discharge Summary   Patient ID: [name] Daniels MRN: [id number], DOB/AGE: [birth year] 79 y.o. Admit date: 07/09/2013 D/C date:     07/12/2013  Primary Cardiologist: [name]  Primary Discharge Diagnoses:  1. Paroxysmal atrial fibrillation - initially diagnosed 12/2011 - admitted recently at [place] and placed on sotalol which prompted conversion to NSR but led to bradycardia - this admission: AF-RVR -> due to known severe bradycardia, she underwent pacemaker implantation with amiodarone initiation 2. Bradycardia (in context of AF-RVR requiring rate-controlling agents) - s/p Medtronic pacemaker 3. Renal insufficiency - Cr 1.21 at discharge  Secondary Discharge Diagnoses:  1, GERD 2. CVA, lacunar - identified by CT scanning 01/01/12 3. Admission for HFpEF at [facility] 06/2013  Hospital Course: [name] Daniels is an 79 y/o F with history of atrial fibrillation and CVA. She was hospitalized at [facility] for asymptomatic a-fib with RVR 2-3 weeks ago. Her cardiologist there was Dr. Lisbeth Ply who discharged the patient on Sotalol 80mg  BID which was associated with conversion to sinus rhythm. Because of bradycardia, the sotalol dose was later decreased to 40mg . She was then hospitalized again at [facility] earlier this month for what she recalls was CHF exacerbation and pneumonia. She followed up with Dr. [name] in office on 07/02/13 at which time the Sotalol was discontinued. Since then, she had felt well until the morning of this admission on 07/09/13. She experienced sudden onset right "side pain" followed by 2-3 episodes of vomiting and clamminess. No hematemesis, abdominal pain, feeling of racing heart, chest pain, SOB, or dizziness. These occurences prompted patient's niece to check vitals and reportedly, HR at that time was 180 and BP was unattainable, prompting the ER visit. In the ED, she was found to be back in AF-RVR with HR in the 140s-150s. However, even in the setting of rapid rates, she was asymptomatic at  time of cardiology evaluation thus it was unclear how long she had actually been in AF. She endorsed medication compliance. EKG showed atrial fibrillation with RVR, ventricular rate 160 bpm, ST depression seen in leads I, II, III, AVF, V3-V6. She was treated with IV diltiazem and admitted to the hospital for further evaluation. Troponins remained negative. Further titration of diltiazem was limited by her hypotension. It was also noted that she had known severe bradycardia which previously prompted discontinuation of most rate-controlling agents. Her QT was too long to allow for Tikosyn, so options were limited. Dr. [name] recommended pacemaker followed by amiodarone initiation with cardioversion. She was started on IV amiodarone. Baseline TSH and LFTs were drawn. She underwent Medtronic pacemaker implantation 07/10/13. She spontaneously converted to NSR with a few PACs while on amiodarone and this was changed to oral dosing. She feels much better today. Dr. [name] has seen and examined her and feels she is stable for discharge.  Dr. [name] recommends amiodarone 400mg  BID x 2 weeks, then 400mg  DAILY x 2 weeks, then 200mg  DAILY. He recommends wound check in 10 days, PFTs with DLCO at that time, and follow up with SK/BE in 6 weeks. She will remain off lisinopril. Per discussion with Dr. [name], will hold off on any ischemic testing at this time. Further monitoring of organ systems will be at discretion of her primary cardiologist in followup. Per discussion with pharmacy, will hold Coumadin today, give 2mg  daily over the weekend, and she will have INR checked at [facility] on Monday 07/15/13 at 9am.  Discharge Vitals: Blood pressure 138/72, pulse 77, temperature 98.4 F (36.9 C), temperature source Oral,  resp. rate 14, height 4\' 11"  (1.499 m), weight 113 lb 8.6 oz (51.5 kg), SpO2 98.00%.  Labs: Lab Results  Component Value Date   WBC 12.3* 07/09/2013   HGB 12.4 07/09/2013   HCT 34.3* 07/09/2013   MCV  84.7 07/09/2013   PLT 276 07/09/2013     Recent Labs Lab 07/10/13 0521 07/11/13 0920  NA 136 133*  K 4.1 4.1  CL 98 94*  CO2 26 27  BUN 19 16  CREATININE 1.11* 1.21*  CALCIUM 8.6 8.7  PROT 6.6  --   BILITOT 0.3  --   ALKPHOS 64  --   ALT 12  --   AST 17  --   GLUCOSE 122* 130*    Recent Labs  07/09/13 2305 07/10/13 0521 07/10/13 1136  TROPONINI <0.30 <0.30 <0.30   Lab Results  Component Value Date   CHOL 138 07/10/2013   HDL 53 07/10/2013   LDLCALC 69 07/10/2013   TRIG 82 07/10/2013   No results found for this basename: DDIMER    Diagnostic Studies/Procedures   Dg Chest Portable 1 View  07/10/2013   *RADIOLOGY REPORT*  Clinical Data: Pacer placement  PORTABLE CHEST - 1 VIEW  Comparison:  07/09/2013  Findings: There is a left chest wall pacer device with lead in the right atrial appendage and right ventricle.  No pneumothorax after pacer placement.  Normal heart size.  No pleural effusion or edema. Lungs are hyperinflated but clear.  IMPRESSION:  1.  No pneumothorax is visualized after pacer placement.   Original Report Authenticated By: Signa Kell, M.D.   Dg Chest Port 1 View  07/09/2013   *RADIOLOGY REPORT*  Clinical Data: Chest pain  PORTABLE CHEST - 1 VIEW  Comparison: 06/28/1939  Findings: The cardiomediastinal silhouette is stable. Atherosclerotic calcifications of the thoracic aorta again noted. Small hiatal hernia. Mild hyperinflation again noted.  IMPRESSION: Mild hyperinflation again noted.  Small hiatal hernia.  No active disease.   Original Report Authenticated By: Natasha Mead, M.D.    Discharge Medications     Medication List    STOP taking these medications       diltiazem 180 MG 24 hr capsule  Commonly known as:  CARDIZEM CD     levofloxacin 750 MG tablet  Commonly known as:  LEVAQUIN     lisinopril 10 MG tablet  Commonly known as:  PRINIVIL,ZESTRIL      TAKE these medications       amiodarone 200 MG tablet  Commonly known as:  PACERONE    Take 2 tablets by mouth twice a day for 2 weeks, then decrease to 2 tablets once a day for 2 weeks, then decrease to 1 tablet once a day.     BESIVANCE 0.6 % Susp  Generic drug:  Besifloxacin HCl  Place 1 drop into the left eye 3 (three) times daily.     DUREZOL 0.05 % Emul  Generic drug:  Difluprednate  Place 1 drop into the left eye 3 (three) times daily.     furosemide 40 MG tablet  Commonly known as:  LASIX  Take 1 tablet (40 mg total) by mouth 2 (two) times daily.     ILEVRO 0.3 % Susp  Generic drug:  Nepafenac  Apply 1 drop to eye at bedtime. Left eye     LIPITOR 20 MG tablet  Generic drug:  atorvastatin  Take 20 mg by mouth at bedtime.     potassium chloride 20 MEQ packet  Commonly  known as:  KLOR-CON  Take 20 mEq by mouth 2 (two) times daily.     warfarin 4 MG tablet  Commonly known as:  COUMADIN  Do not take any Coumadin tonight (07/12/13). On Saturday 07/13/13, take 1/2 tablet (2mg ) by mouth. On Sunday 07/14/13, take 1/2 tablet (2mg ). You need to have your Coumadin level checked on Monday 07/15/13 to see what your next dose should be.      On pt's AVS, it may show up to "change" how she takes the Lasix. She was taking 40mg  BID prior to admission but the admission pharmacy tech had written the note "for 30 days." Even though dose is the same, when I removed the comment "for 30 days" it now tells the patient that this is a dose change. Nursing informed the patient she is continue home dose of 40mg  BID.  Disposition   The patient will be discharged in stable condition to home. Discharge Orders   Future Appointments Provider Department Dept Phone   07/22/2013 4:00 PM [facility] [address] Main Office [place]) [phone]   08/09/2013 4:00 PM [facility] Room [place] [facility] [phone]   08/23/2013 10:30 AM Minda Meo, PA-C  [facility] Main Office [place]) [phone]   Future Orders Complete By Expires     Diet - low sodium  heart healthy  As directed     Discharge instructions  As directed     Comments:      Patients on amiodarone may need intermittent check-ups of other organ systems, such as lungs, thyroid, eyes, and liver function. Dr. [name] has scheduled you for a lung function test. Please talk to your doctor at your follow-up appointments about what monitoring may be needed for you.    Increase activity slowly  As directed     Comments:      Please see attached sheet at the end of your After-Visit Summary for instructions on wound care, activity, and bathing.      Follow-up Information   Follow up with [facility]. (Monday 07/15/13 at 9am for Coumadin level check)    Contact information:   [facility]  [address] [place], [place] [address]  [phone]       Follow up with [facility]. (Pacemaker wound check on Monday 07/22/13 at 4pm)    Contact information:   [facility] [address] [place] [address] [phone]       Follow up with [facility]. (Lung function test on 08/09/13 at 4pm - this is a test recommended for patients starting on amiodarone. Do not use any inhalers 3 hours before the test.)    Contact information:   [address] [place] [address] [phone]      Follow up with Rick Duff, PA-C. (08/23/13 at 10:30am)    Contact information:   [facility] [address] [place] [address] [phone]         Duration of Discharge Encounter: Greater than 30 minutes including physician and PA time.  Signed, Ronie Spies PA-C 07/12/2013, 12:34 PM

## 2013-07-22 ENCOUNTER — Encounter: Payer: Self-pay | Admitting: Internal Medicine

## 2013-07-22 ENCOUNTER — Ambulatory Visit (INDEPENDENT_AMBULATORY_CARE_PROVIDER_SITE_OTHER): Payer: Medicare Other | Admitting: *Deleted

## 2013-07-22 DIAGNOSIS — I5032 Chronic diastolic (congestive) heart failure: Secondary | ICD-10-CM

## 2013-07-22 DIAGNOSIS — I4891 Unspecified atrial fibrillation: Secondary | ICD-10-CM

## 2013-07-22 LAB — PACEMAKER DEVICE OBSERVATION
AL AMPLITUDE: 1.4 mv
BAMS-0001: 150 {beats}/min
BATTERY VOLTAGE: 2.8 V
RV LEAD AMPLITUDE: 22.4 mv

## 2013-07-22 NOTE — Progress Notes (Signed)
Wound check-PPM 

## 2013-08-01 ENCOUNTER — Encounter: Payer: Self-pay | Admitting: Cardiology

## 2013-08-03 NOTE — H&P (Signed)
Pt seen same day  Documentation is in progress note

## 2013-08-09 ENCOUNTER — Encounter (INDEPENDENT_AMBULATORY_CARE_PROVIDER_SITE_OTHER): Payer: Medicare Other

## 2013-08-09 DIAGNOSIS — I4891 Unspecified atrial fibrillation: Secondary | ICD-10-CM

## 2013-08-09 LAB — PULMONARY FUNCTION TEST

## 2013-08-23 ENCOUNTER — Encounter: Payer: Medicare Other | Admitting: Cardiology

## 2013-08-27 ENCOUNTER — Ambulatory Visit (INDEPENDENT_AMBULATORY_CARE_PROVIDER_SITE_OTHER): Payer: Medicare Other | Admitting: Cardiology

## 2013-08-27 ENCOUNTER — Encounter: Payer: Self-pay | Admitting: Cardiology

## 2013-08-27 VITALS — BP 127/43 | HR 60 | Ht 59.0 in | Wt 114.0 lb

## 2013-08-27 DIAGNOSIS — I495 Sick sinus syndrome: Secondary | ICD-10-CM

## 2013-08-27 DIAGNOSIS — Z950 Presence of cardiac pacemaker: Secondary | ICD-10-CM

## 2013-08-27 DIAGNOSIS — I4891 Unspecified atrial fibrillation: Secondary | ICD-10-CM

## 2013-08-27 DIAGNOSIS — N289 Disorder of kidney and ureter, unspecified: Secondary | ICD-10-CM

## 2013-08-27 DIAGNOSIS — I5032 Chronic diastolic (congestive) heart failure: Secondary | ICD-10-CM

## 2013-08-27 NOTE — Patient Instructions (Addendum)
Your physician recommends that you schedule a follow-up appointment in: October 18, 2013 AT 9:15  Your physician recommends that you return for lab work in: TODAY BMET  Your physician recommends that you continue on your current medications as directed. Please refer to the Current Medication list given to you today.

## 2013-08-28 ENCOUNTER — Other Ambulatory Visit: Payer: Self-pay | Admitting: *Deleted

## 2013-08-28 ENCOUNTER — Telehealth: Payer: Self-pay | Admitting: Internal Medicine

## 2013-08-28 DIAGNOSIS — I10 Essential (primary) hypertension: Secondary | ICD-10-CM

## 2013-08-28 DIAGNOSIS — I4891 Unspecified atrial fibrillation: Secondary | ICD-10-CM

## 2013-08-28 DIAGNOSIS — I5032 Chronic diastolic (congestive) heart failure: Secondary | ICD-10-CM

## 2013-08-28 LAB — BASIC METABOLIC PANEL
BUN: 31 mg/dL — ABNORMAL HIGH (ref 6–23)
Chloride: 97 mEq/L (ref 96–112)
Creatinine, Ser: 1.6 mg/dL — ABNORMAL HIGH (ref 0.4–1.2)
Glucose, Bld: 111 mg/dL — ABNORMAL HIGH (ref 70–99)

## 2013-08-28 LAB — PACEMAKER DEVICE OBSERVATION
AL IMPEDENCE PM: 692 Ohm
ATRIAL PACING PM: 85.8
BATTERY VOLTAGE: 2.8 V
RV LEAD IMPEDENCE PM: 869 Ohm
VENTRICULAR PACING PM: 99.1

## 2013-08-28 NOTE — Telephone Encounter (Signed)
New Problem  Niece calls (care taker) wants to know what medication pt is supposed to stop taking and why she needs a new lab appt.

## 2013-08-28 NOTE — Telephone Encounter (Signed)
Spoke with niece who was very upset that we are calling and talking to pt.  She states pt does not always understand everything and that she (niece/caretaker) is to be the one contacted about care/needs for pt. Explained what instructions Amity PA had for abnormal lab. Changed lab appointment per request.

## 2013-08-28 NOTE — Progress Notes (Signed)
Pt informed of this and showed understanding.

## 2013-08-29 NOTE — Progress Notes (Signed)
ELECTROPHYSIOLOGY OFFICE NOTE  Patient ID: [name] Daniels MRN: [id number], DOB/AGE: [birth year]   Date of Visit: 08/27/2013  Primary Physician: Dema Severin, NP Primary Cardiologist: Berton Mount, MD Reason for Visit: Hospital follow-up  History of Present Illness  [name] Daniels is a 79 y.o. female with history of CVA, CKD, normal LexiScan Myoview Jan 2013, normal LVEF, atrial fibrillation and tachy-brady syndrome s/p recent PPM implantation July 2014 who presents today for hospital followup. She is accompanied by her daughter.   Since discharge, she reports she is doing well and has no complaints. She denies chest pain or shortness of breath. She denies palpitations, dizziness, near syncope or syncope. She denies LE swelling, orthopnea, PND or recent weight gain. She is compliant and tolerating medications without difficulty. She completed the amiodarone loading and is now taking amiodarone 200 mg once daily as directed by Dr. [name] at discharge. Her INR is followed by PCP. Serum Cr at discharge was 1.2.  Past Medical History Past Medical History  Diagnosis Date  . Atrial fibrillation     a. Dx 12/2011. b. Adm at [facility] 2014 placed on sotalol but had bradycardia with this. c. Adm 06/2013 with AF-RVR - due to h/o bradycardia, underwent Medtronic ppm & amiodarone initiation (spont conv to NSR).   [name] GERD (gastroesophageal reflux disease)   . Stroke, lacunar-identified by CT scanning 01/01/2012  . CHF (congestive heart failure)     a. Admission for HFpEF at [facility] ~06/2013.  [name] Pneumonia 06/2013  . Renal insufficiency     a. noted by labs 06/2013.    Past Surgical History Past Surgical History  Procedure Laterality Date  . Tonsillectomy  ~ 1950  . Cataract extraction w/ intraocular lens implant Left 07/08/2013  . Pacemaker insertion  07-10-2013    MDT ADDRL1 pacemaker implanted by Dr [name] for tachy-brady syndrome    Allergies/Intolerances No Known Allergies  Current Home  Medications Current Outpatient Prescriptions  Medication Sig Dispense Refill  . amiodarone (PACERONE) 200 MG tablet Take 200 mg by mouth daily.      [name] atorvastatin (LIPITOR) 20 MG tablet Take 10 mg by mouth at bedtime.   30 tablet  11  . warfarin (COUMADIN) 4 MG tablet Take 2 mg by mouth daily.        No current facility-administered medications for this visit.   Social History Social History  . Marital Status: Widowed   Occupational History  . Retired    Social History Main Topics  . Smoking status: Never Smoker   . Smokeless tobacco: Never Used  . Alcohol Use: No  . Drug Use: No   Social History Narrative   Pt lives alone. Her brother-in-law visits daily. Her niece and nephew help also.    Review of Systems General: No chills, fever, night sweats or weight changes Cardiovascular: No chest pain, dyspnea on exertion, edema, orthopnea, palpitations, paroxysmal nocturnal dyspnea Dermatological: No rash, lesions or masses Respiratory: No cough, dyspnea Urologic: No hematuria, dysuria Abdominal: No nausea, vomiting, diarrhea, bright red blood per rectum, melena, or hematemesis Neurologic: No visual changes, weakness, changes in mental status All other systems reviewed and are otherwise negative except as noted above.  Physical Exam Vitals: Blood pressure 127/43, pulse 60, height 4\' 11"  (1.499 m), weight 114 lb (51.71 kg).  General: Well developed, well appearing 79 y.o. female in no acute distress. HEENT: Normocephalic, atraumatic. EOMs intact. Sclera nonicteric. Oropharynx clear.  Neck: Supple without bruits. No JVD. Lungs: Respirations regular and unlabored, CTA bilaterally.  No wheezes, rales or rhonchi. Heart: RRR. S1, S2 present. No murmurs, rub, S3 or S4. Abdomen: Soft, non-distended. Extremities: No clubbing, cyanosis or edema. PT/Radials 2+ and equal bilaterally. Psych: Normal affect. Neuro: Alert and oriented X 3. Moves all extremities spontaneously.    Diagnostics Echocardiogram from [facility] 06/26/2013 Technically adequate; LV size and systolic function normal; LVEF 55-60%; RV size and systolic function normal; normal atria sizes; mild MR; mild-mod TR Device interrogation today - Normal device function. Thresholds, sensing, impedances consistent with previous measurements. Device programmed to maximize longevity. No mode switch or high ventricular rates noted. Device programmed at appropriate safety margins. Histogram distribution appropriate for patient activity level. Device programmed to optimize intrinsic conduction. Estimated longevity 10 years.  Assessment and Plan 1. PAF Stable  No evidence of AF on device interrogation today Continue rhythm control with amiodarone at 200 mg once daily   Start routine follow-up for labs, PFTs for amiodarone surveillance Continue warfarin for stroke risk reduction; INR followed by PCP 2. Tachy-brady syndrome s/p PPM implant Normal device function No programming changes made Return to see Dr. [name] or me in 6 weeks for PPM follow-up 3 months post implant  3. Chronic diastolic HF Stable Continue medical therapy (see #4 discussion regarding Lasix) 4. Renal insufficiency Serum Cr at discharge 1.2 Discharged on Lasix 40 mg twice daily (was a PTA med - given during recent admission at [facility]) Probably does not need daily Lasix given she has preserved LVEF and no HF symptoms Will repeat BMET to follow renal function and adjust Lasix accordingly  Signed, Beza Steppe, PA-C 08/29/2013, 9:15 AM

## 2013-09-03 ENCOUNTER — Ambulatory Visit: Payer: Medicare HMO | Admitting: Internal Medicine

## 2013-09-03 DIAGNOSIS — I10 Essential (primary) hypertension: Secondary | ICD-10-CM

## 2013-09-03 DIAGNOSIS — I4891 Unspecified atrial fibrillation: Secondary | ICD-10-CM

## 2013-09-03 DIAGNOSIS — I5032 Chronic diastolic (congestive) heart failure: Secondary | ICD-10-CM

## 2013-09-04 ENCOUNTER — Other Ambulatory Visit: Payer: Medicare Other

## 2013-09-09 NOTE — Telephone Encounter (Signed)
New problem   Pt want to know results of lab. Pt has gained 6lb since she they took her off fluid pill.

## 2013-09-09 NOTE — Telephone Encounter (Signed)
Explained Dr. [name] is out of office until tomorrow afternoon, pt agreeable to waiting to discuss with him for further instructions/recommendations.

## 2013-09-12 NOTE — Telephone Encounter (Signed)
Could not view results of requested lab until today - I spoke with Rick Duff PA about this. Called pt with instructions and informed by Gavin Pound (emergency contact) that pt was back in hospital for SOB and fluid overload.

## 2013-09-20 ENCOUNTER — Telehealth: Payer: Self-pay | Admitting: Internal Medicine

## 2013-09-20 NOTE — Telephone Encounter (Signed)
Follow up:  Pt's niece states she is calling for lab results. Please call this # [phone]

## 2013-10-10 ENCOUNTER — Encounter: Payer: Self-pay | Admitting: Internal Medicine

## 2013-10-18 ENCOUNTER — Ambulatory Visit (INDEPENDENT_AMBULATORY_CARE_PROVIDER_SITE_OTHER): Payer: Medicare Other | Admitting: Internal Medicine

## 2013-10-18 ENCOUNTER — Ambulatory Visit: Payer: Medicare Other | Admitting: Internal Medicine

## 2013-10-18 ENCOUNTER — Encounter: Payer: Self-pay | Admitting: Internal Medicine

## 2013-10-18 VITALS — BP 173/62 | HR 85 | Ht 59.0 in | Wt 116.0 lb

## 2013-10-18 DIAGNOSIS — Z45018 Encounter for adjustment and management of other part of cardiac pacemaker: Secondary | ICD-10-CM | POA: Insufficient documentation

## 2013-10-18 DIAGNOSIS — I495 Sick sinus syndrome: Secondary | ICD-10-CM

## 2013-10-18 DIAGNOSIS — Z950 Presence of cardiac pacemaker: Secondary | ICD-10-CM

## 2013-10-18 DIAGNOSIS — I4891 Unspecified atrial fibrillation: Secondary | ICD-10-CM

## 2013-10-18 DIAGNOSIS — I498 Other specified cardiac arrhythmias: Secondary | ICD-10-CM

## 2013-10-18 HISTORY — DX: Other specified cardiac arrhythmias: I49.8

## 2013-10-18 HISTORY — DX: Sick sinus syndrome: I49.5

## 2013-10-18 LAB — PACEMAKER DEVICE OBSERVATION
AL THRESHOLD: 1 V
ATRIAL PACING PM: 83
RV LEAD AMPLITUDE: 22.4 mv
RV LEAD IMPEDENCE PM: 887 Ohm
RV LEAD THRESHOLD: 0.5 V

## 2013-10-18 LAB — BASIC METABOLIC PANEL
BUN: 15 mg/dL (ref 6–23)
Calcium: 8.9 mg/dL (ref 8.4–10.5)
Creatinine, Ser: 1.1 mg/dL (ref 0.4–1.2)

## 2013-10-18 LAB — HEPATIC FUNCTION PANEL
Albumin: 3.8 g/dL (ref 3.5–5.2)
Total Bilirubin: 0.4 mg/dL (ref 0.3–1.2)

## 2013-10-18 LAB — TSH: TSH: 3.53 u[IU]/mL (ref 0.35–5.50)

## 2013-10-18 NOTE — Assessment & Plan Note (Signed)
We will activate rate response. Hopefully this will help with exercise tolerance.

## 2013-10-18 NOTE — Assessment & Plan Note (Signed)
The patient's device was interrogated and the information was fully reviewed.  The device was reprogrammed to   Maximize longevity and activate rate response

## 2013-10-18 NOTE — Assessment & Plan Note (Signed)
No intercurrent atrial fibrillation. On amiodarone. Without GI symptoms or cough. We'll check surveillance laboratories.

## 2013-10-18 NOTE — Patient Instructions (Signed)
Remote monitoring is used to monitor your Pacemaker of ICD from home. This monitoring reduces the number of office visits required to check your device to one time per year. It allows [place] to keep an eye on the functioning of your device to ensure it is working properly. You are scheduled for a device check from home on 01-17-2014. You may send your transmission at any time that day. If you have a wireless device, the transmission will be sent automatically. After your physician reviews your transmission, you will receive a postcard with your next transmission date.  Your physician wants you to follow-up in: 9 MONTHS WITH DR Logan Bores will receive a reminder letter in the mail two months in advance. If you don't receive a letter, please call our office to schedule the follow-up appointment.

## 2013-10-18 NOTE — Progress Notes (Signed)
      Patient Care Team: Dema Severin, NP as PCP - General   HPI  [name] Daniels is a 79 y.o. female  Seen in followup for pacemaker implanted 7/14 for tachybradycardia syndrome.  Problem of exercise tolerance. She says her legs hurt.  Past Medical History  Diagnosis Date  . Atrial fibrillation     a. Dx 12/2011. b. Adm at [facility] 2014 placed on sotalol but had bradycardia with this. c. Adm 06/2013 with AF-RVR - due to h/o bradycardia, underwent Medtronic ppm & amiodarone initiation (spont conv to NSR).   [name] GERD (gastroesophageal reflux disease)   . Stroke, lacunar-identified by CT scanning 01/01/2012  . CHF (congestive heart failure)     a. Admission for HFpEF at [facility] ~06/2013.  [name] Pneumonia 06/2013  . Renal insufficiency     a. noted by labs 06/2013.    Past Surgical History  Procedure Laterality Date  . Tonsillectomy  ~ 1950  . Cataract extraction w/ intraocular lens implant Left 07/08/2013  . Pacemaker insertion  07-10-2013    MDT ADDRL1 pacemaker implanted by Dr [name] for tachy-brady syndrome    Current Outpatient Prescriptions  Medication Sig Dispense Refill  . amiodarone (PACERONE) 200 MG tablet Take 200 mg by mouth daily.      [name] atorvastatin (LIPITOR) 20 MG tablet Take 10 mg by mouth at bedtime.   30 tablet  11  . ferrous gluconate (FERGON) 325 MG tablet Take 325 mg by mouth daily with breakfast.      . furosemide (LASIX) 40 MG tablet 20 mg daily.      [name] lisinopril (PRINIVIL,ZESTRIL) 10 MG tablet 10 mg daily.      . vitamin B-12 (CYANOCOBALAMIN) 500 MCG tablet Take 500 mcg by mouth daily.      [name] warfarin (COUMADIN) 4 MG tablet Take 2 mg by mouth daily.        No current facility-administered medications for this visit.    No Known Allergies  Review of Systems negative except from HPI and PMH  Physical Exam BP 173/62  Pulse 85  Ht 4\' 11"  (1.499 m)  Wt 116 lb (52.617 kg)  BMI 23.42 kg/m2 Well developed and nourished in no acute distress HENT normal Neck  supple with JVP-flat Clear Device pocket well healed; without hematoma or erythema.  There is no tethering  Regular rate and rhythm, no murmurs or gallops Abd-soft with active BS No Clubbing cyanosis edema Skin-warm and dry A & Oriented  Grossly normal sensory and motor function  ECG demonstrates atrial pacing  intervals 17/09/48   Assessment and  Plan

## 2013-10-18 NOTE — Assessment & Plan Note (Signed)
She has had intercurrent hospitalization. On Lasix. We'll check a metabolic profile her potassium.

## 2013-10-25 ENCOUNTER — Encounter: Payer: Self-pay | Admitting: *Deleted

## 2013-10-31 ENCOUNTER — Other Ambulatory Visit: Payer: Self-pay

## 2013-11-29 ENCOUNTER — Ambulatory Visit: Payer: Medicare Other | Admitting: Internal Medicine

## 2013-12-05 ENCOUNTER — Other Ambulatory Visit: Payer: Self-pay | Admitting: *Deleted

## 2013-12-05 DIAGNOSIS — I495 Sick sinus syndrome: Secondary | ICD-10-CM

## 2013-12-05 DIAGNOSIS — I5032 Chronic diastolic (congestive) heart failure: Secondary | ICD-10-CM

## 2013-12-05 DIAGNOSIS — Z950 Presence of cardiac pacemaker: Secondary | ICD-10-CM

## 2013-12-05 DIAGNOSIS — I4891 Unspecified atrial fibrillation: Secondary | ICD-10-CM

## 2013-12-05 DIAGNOSIS — N289 Disorder of kidney and ureter, unspecified: Secondary | ICD-10-CM

## 2013-12-05 MED ORDER — AMIODARONE HCL 200 MG PO TABS: 200.0000 mg | ORAL_TABLET | Freq: Every day | ORAL | Status: AC

## 2014-01-17 ENCOUNTER — Ambulatory Visit (INDEPENDENT_AMBULATORY_CARE_PROVIDER_SITE_OTHER): Payer: Commercial Managed Care - HMO | Admitting: *Deleted

## 2014-01-17 DIAGNOSIS — I4891 Unspecified atrial fibrillation: Secondary | ICD-10-CM

## 2014-01-17 LAB — MDC_IDC_ENUM_SESS_TYPE_REMOTE
Battery Remaining Longevity: 11
Battery Voltage: 2.8 V
Brady Statistic AP VP Percent: 92.1 %
Brady Statistic AP VS Percent: 5.9 %
Brady Statistic AS VP Percent: 1.8 %
Lead Channel Impedance Value: 548 Ohm
Lead Channel Impedance Value: 848 Ohm
Lead Channel Setting Pacing Amplitude: 2 V
Lead Channel Setting Pacing Pulse Width: 0.4 ms
Lead Channel Setting Sensing Sensitivity: 5.6 mV
MDC IDC MSMT BATTERY IMPEDANCE: 100 Ohm
MDC IDC MSMT LEADCHNL RA PACING THRESHOLD AMPLITUDE: 1.25 V
MDC IDC MSMT LEADCHNL RA PACING THRESHOLD PULSEWIDTH: 0.4 ms
MDC IDC MSMT LEADCHNL RV PACING THRESHOLD AMPLITUDE: 0.75 V
MDC IDC MSMT LEADCHNL RV PACING THRESHOLD PULSEWIDTH: 0.4 ms
MDC IDC SET LEADCHNL RA PACING AMPLITUDE: 2 V
MDC IDC STAT BRADY AS VS PERCENT: 0.2 %

## 2014-02-04 ENCOUNTER — Encounter: Payer: Self-pay | Admitting: *Deleted

## 2014-02-13 ENCOUNTER — Encounter: Payer: Self-pay | Admitting: Internal Medicine

## 2014-02-23 ENCOUNTER — Encounter (HOSPITAL_COMMUNITY): Payer: Self-pay | Admitting: Emergency Medicine

## 2014-02-23 ENCOUNTER — Inpatient Hospital Stay (HOSPITAL_COMMUNITY)
Admission: EM | Admit: 2014-02-23 | Discharge: 2014-03-01 | DRG: 261 | Disposition: A | Discharge status: Home Health | Payer: Medicare HMO | Attending: Internal Medicine | Admitting: Internal Medicine

## 2014-02-23 ENCOUNTER — Inpatient Hospital Stay (HOSPITAL_COMMUNITY): Payer: Medicare HMO

## 2014-02-23 DIAGNOSIS — I495 Sick sinus syndrome: Secondary | ICD-10-CM

## 2014-02-23 DIAGNOSIS — T827XXA Infection and inflammatory reaction due to other cardiac and vascular devices, implants and grafts, initial encounter: Secondary | ICD-10-CM

## 2014-02-23 DIAGNOSIS — I6381 Other cerebral infarction due to occlusion or stenosis of small artery: Secondary | ICD-10-CM

## 2014-02-23 DIAGNOSIS — R1310 Dysphagia, unspecified: Secondary | ICD-10-CM | POA: Diagnosis present

## 2014-02-23 DIAGNOSIS — Z8673 Personal history of transient ischemic attack (TIA), and cerebral infarction without residual deficits: Secondary | ICD-10-CM

## 2014-02-23 DIAGNOSIS — I509 Heart failure, unspecified: Secondary | ICD-10-CM | POA: Diagnosis present

## 2014-02-23 DIAGNOSIS — N289 Disorder of kidney and ureter, unspecified: Secondary | ICD-10-CM | POA: Diagnosis present

## 2014-02-23 DIAGNOSIS — Y831 Surgical operation with implant of artificial internal device as the cause of abnormal reaction of the patient, or of later complication, without mention of misadventure at the time of the procedure: Secondary | ICD-10-CM | POA: Diagnosis present

## 2014-02-23 DIAGNOSIS — I10 Essential (primary) hypertension: Secondary | ICD-10-CM | POA: Diagnosis present

## 2014-02-23 DIAGNOSIS — Z79899 Other long term (current) drug therapy: Secondary | ICD-10-CM

## 2014-02-23 DIAGNOSIS — I4891 Unspecified atrial fibrillation: Secondary | ICD-10-CM | POA: Diagnosis present

## 2014-02-23 DIAGNOSIS — R7881 Bacteremia: Secondary | ICD-10-CM

## 2014-02-23 DIAGNOSIS — Z7901 Long term (current) use of anticoagulants: Secondary | ICD-10-CM

## 2014-02-23 DIAGNOSIS — Z881 Allergy status to other antibiotic agents status: Secondary | ICD-10-CM

## 2014-02-23 DIAGNOSIS — I129 Hypertensive chronic kidney disease with stage 1 through stage 4 chronic kidney disease, or unspecified chronic kidney disease: Secondary | ICD-10-CM | POA: Diagnosis present

## 2014-02-23 DIAGNOSIS — Z45018 Encounter for adjustment and management of other part of cardiac pacemaker: Secondary | ICD-10-CM

## 2014-02-23 DIAGNOSIS — N189 Chronic kidney disease, unspecified: Secondary | ICD-10-CM | POA: Diagnosis present

## 2014-02-23 DIAGNOSIS — K219 Gastro-esophageal reflux disease without esophagitis: Secondary | ICD-10-CM | POA: Diagnosis present

## 2014-02-23 DIAGNOSIS — I5032 Chronic diastolic (congestive) heart failure: Secondary | ICD-10-CM | POA: Diagnosis present

## 2014-02-23 HISTORY — DX: Bacteremia: R78.81

## 2014-02-23 HISTORY — DX: Sick sinus syndrome: I49.5

## 2014-02-23 LAB — CBC WITH DIFFERENTIAL/PLATELET
Basophils Absolute: 0 10*3/uL (ref 0.0–0.1)
Basophils Relative: 0 % (ref 0–1)
EOS PCT: 1 % (ref 0–5)
Eosinophils Absolute: 0.1 10*3/uL (ref 0.0–0.7)
HCT: 35.6 % — ABNORMAL LOW (ref 36.0–46.0)
HEMOGLOBIN: 12.2 g/dL (ref 12.0–15.0)
LYMPHS PCT: 19 % (ref 12–46)
Lymphs Abs: 1.7 10*3/uL (ref 0.7–4.0)
MCH: 31.6 pg (ref 26.0–34.0)
MCHC: 34.3 g/dL (ref 30.0–36.0)
MCV: 92.2 fL (ref 78.0–100.0)
MONO ABS: 1 10*3/uL (ref 0.1–1.0)
MONOS PCT: 12 % (ref 3–12)
NEUTROS ABS: 5.8 10*3/uL (ref 1.7–7.7)
Neutrophils Relative %: 67 % (ref 43–77)
Platelets: 267 10*3/uL (ref 150–400)
RBC: 3.86 MIL/uL — ABNORMAL LOW (ref 3.87–5.11)
RDW: 15.6 % — ABNORMAL HIGH (ref 11.5–15.5)
WBC: 8.6 10*3/uL (ref 4.0–10.5)

## 2014-02-23 LAB — PROTIME-INR
INR: 2.72 — ABNORMAL HIGH (ref 0.00–1.49)
Prothrombin Time: 27.9 seconds — ABNORMAL HIGH (ref 11.6–15.2)

## 2014-02-23 LAB — BASIC METABOLIC PANEL
BUN: 15 mg/dL (ref 6–23)
CHLORIDE: 98 meq/L (ref 96–112)
CO2: 28 mEq/L (ref 19–32)
CREATININE: 1.06 mg/dL (ref 0.50–1.10)
Calcium: 8.5 mg/dL (ref 8.4–10.5)
GFR calc Af Amer: 56 mL/min — ABNORMAL LOW (ref >90)
GFR calc non Af Amer: 48 mL/min — ABNORMAL LOW (ref >90)
GLUCOSE: 99 mg/dL (ref 70–99)
POTASSIUM: 4.1 meq/L (ref 3.7–5.3)
Sodium: 140 mEq/L (ref 137–147)

## 2014-02-23 MED ORDER — VANCOMYCIN HCL 500 MG IV SOLR
500.0000 mg | INTRAVENOUS | Status: DC
  Administered 2014-02-24 – 2014-02-27 (×4): 500 mg via INTRAVENOUS
  Filled 2014-02-23 (×4): qty 500

## 2014-02-23 MED ORDER — IRON 325 (65 FE) MG PO TABS: 65.0000 mg | ORAL_TABLET | Freq: Every day | ORAL | Status: DC

## 2014-02-23 MED ORDER — FUROSEMIDE 20 MG PO TABS
20.0000 mg | ORAL_TABLET | Freq: Every day | ORAL | Status: DC
  Administered 2014-02-24 – 2014-03-01 (×5): 20 mg via ORAL
  Filled 2014-02-23 (×7): qty 1

## 2014-02-23 MED ORDER — AMIODARONE HCL 200 MG PO TABS
200.0000 mg | ORAL_TABLET | Freq: Every day | ORAL | Status: DC
  Administered 2014-02-24 – 2014-03-01 (×6): 200 mg via ORAL
  Filled 2014-02-23 (×6): qty 1

## 2014-02-23 MED ORDER — CYANOCOBALAMIN 500 MCG PO TABS
500.0000 ug | ORAL_TABLET | Freq: Every day | ORAL | Status: DC
  Administered 2014-02-24 – 2014-03-01 (×6): 500 ug via ORAL
  Filled 2014-02-23 (×6): qty 1

## 2014-02-23 MED ORDER — SODIUM CHLORIDE 0.9 % IJ SOLN
3.0000 mL | Freq: Two times a day (BID) | INTRAMUSCULAR | Status: DC
  Administered 2014-02-23 – 2014-02-24 (×2): 3 mL via INTRAVENOUS

## 2014-02-23 MED ORDER — VANCOMYCIN HCL IN DEXTROSE 1-5 GM/200ML-% IV SOLN
1000.0000 mg | Freq: Once | INTRAVENOUS | Status: AC
  Administered 2014-02-23: 1000 mg via INTRAVENOUS
  Filled 2014-02-23: qty 200

## 2014-02-23 MED ORDER — ATORVASTATIN CALCIUM 10 MG PO TABS
10.0000 mg | ORAL_TABLET | Freq: Every day | ORAL | Status: DC
  Administered 2014-02-23 – 2014-02-24 (×2): 10 mg via ORAL
  Filled 2014-02-23 (×4): qty 1

## 2014-02-23 MED ORDER — FERROUS GLUCONATE 324 (38 FE) MG PO TABS
325.0000 mg | ORAL_TABLET | Freq: Every day | ORAL | Status: DC
  Administered 2014-02-26 – 2014-02-27 (×2): 324 mg via ORAL
  Administered 2014-02-28 – 2014-03-01 (×2): 325 mg via ORAL
  Filled 2014-02-23 (×7): qty 1

## 2014-02-23 MED ORDER — VITAMIN B-12 500 MCG PO TABS: 500.0000 ug | ORAL_TABLET | Freq: Every day | ORAL | Status: DC

## 2014-02-23 NOTE — ED Notes (Signed)
Pt has skin swelling and pus formation to pacemaker to left upper chest since yesterday. Reports pacemaker placed last year. Denies cp or  Sob. No pain anywhere. No fever. Pt is a x 4. In NAD

## 2014-02-23 NOTE — Progress Notes (Signed)
ANTIBIOTIC CONSULT NOTE - INITIAL  Pharmacy Consult for vancomycin Indication: pacemaker pocket infection  Allergies  Allergen Reactions  . Levaquin [Levofloxacin In D5w] Nausea And Vomiting    Patient Measurements:   Adjusted Body Weight:   Vital Signs: Temp: 98.1 F (36.7 C) (03/01 1456) Temp src: Oral (03/01 1456) BP: 165/54 mmHg (03/01 1751) Pulse Rate: 65 (03/01 1751) Intake/Output from previous day:   Intake/Output from this shift:    Labs:  Recent Labs  02/23/14 1745  WBC 8.6  HGB 12.2  PLT 267  CREATININE 1.06   The CrCl is unknown because both a height and weight (above a minimum accepted value) are required for this calculation. No results found for this basename: VANCOTROUGH, VANCOPEAK, VANCORANDOM, GENTTROUGH, GENTPEAK, GENTRANDOM, TOBRATROUGH, TOBRAPEAK, TOBRARND, AMIKACINPEAK, AMIKACINTROU, AMIKACIN,  in the last 72 hours   Microbiology: No results found for this or any previous visit (from the past 720 hour(s)).  Medical History: Past Medical History  Diagnosis Date  . Atrial fibrillation     a. Dx 12/2011. b. Adm at [facility] 2014 placed on sotalol but had bradycardia with this. c. Adm 06/2013 with AF-RVR - due to h/o bradycardia, underwent Medtronic ppm & amiodarone initiation (spont conv to NSR).   [name]. GERD (gastroesophageal reflux disease)   . Stroke, lacunar-identified by CT scanning 01/01/2012  . CHF (congestive heart failure)     a. [facility].  [name]. Pneumonia 06/2013  . Renal insufficiency     a. noted by labs 06/2013.    Medications:  Anti-infectives   Start     Dose/Rate Route Frequency Ordered Stop   02/24/14 1800  vancomycin (VANCOCIN) 500 mg in sodium chloride 0.9 % 100 mL IVPB     500 mg 100 mL/hr over 60 Minutes Intravenous Every 24 hours 02/23/14 1842     02/23/14 1700  vancomycin (VANCOCIN) IVPB 1000 mg/200 mL premix     1,000 mg 200 mL/hr over 60 Minutes Intravenous  Once 02/23/14 1652        Assessment: 83 yof presented to the ED with skin swelling and pus formation to pacemaker on L upper chest. Pt is afebrile and WBC is WNL. Scr is 1.06. To start empiric vancomycin. She received 1gm in the ED.   Vanc 3/1>>  Goal of Therapy:  Vancomycin trough level 10-15 mcg/ml  Plan:  1. Vancomycin 500mg  IV Q24H 2. F/u renal fxn, C&S, [facility]  Rachna Schonberger, Drake LeachRachel Lynn 02/23/2014,6:42 PM

## 2014-02-23 NOTE — ED Notes (Signed)
IV attempted three times. [name] PageLindsay Daniels at the bedside with US machine. Attempting US IV.

## 2014-02-23 NOTE — ED Provider Notes (Signed)
CSN: [id number]     Arrival date & time 02/23/14  1417 History   First MD Initiated Contact with Patient 02/23/14 [phone]     Chief Complaint  Patient presents with  . swelling over pacemaker      (Consider location/radiation/quality/duration/timing/severity/associated sxs/prior Treatment) The history is provided by the patient.   patient presents with possible infection over pacemaker. Reportedly became red and inflamed and swollen yesterday. No fevers. The pacemaker was put in by Dr. Graciela HusbandsKlein 9 months ago.  she is not diabetic. She is on Coumadin. No trauma to the site. No fevers. No other infections.  Past Medical History  Diagnosis Date  . Atrial fibrillation     a. Dx 12/2011. b. Adm at [facility] 2014 placed on sotalol but had bradycardia with this. c. Adm 06/2013 with AF-RVR - due to h/o bradycardia, underwent Medtronic ppm & amiodarone initiation (spont conv to NSR).   [name]. GERD (gastroesophageal reflux disease)   . Stroke, lacunar-identified by CT scanning 01/01/2012  . CHF (congestive heart failure)     a. [facility].  [name]. Pneumonia 06/2013  . Renal insufficiency     a. noted by labs 06/2013.  . Bacteremia 02/23/2014    gram positive cocci in clusters  . Chronotropic incompetence with sinus node dysfunction 10/18/2013   Past Surgical History  Procedure Laterality Date  . Tonsillectomy  ~ 1950  . Cataract extraction w/ intraocular lens implant Left 07/08/2013  . Pacemaker insertion  07-10-2013    MDT ADDRL1 pacemaker implanted by Dr Graciela HusbandsKlein for tachy-brady syndrome  . Pacemaker removal  02/25/14    Pacemaker system removed by Dr Johney FrameAllred for bateremia   No family history on file. History  Substance Use Topics  . Smoking status: Never Smoker   . Smokeless tobacco: Never Used  . Alcohol Use: No   OB History   Grav Para Term Preterm Abortions TAB SAB Ect Mult Living                 Review of Systems  Constitutional: Negative for chills, activity change and  appetite change.  Eyes: Negative for pain.  Respiratory: Negative for chest tightness and shortness of breath.   Cardiovascular: Negative for chest pain and leg swelling.  Gastrointestinal: Negative for nausea, vomiting, abdominal pain and diarrhea.  Genitourinary: Negative for flank pain.  Musculoskeletal: Negative for back pain and neck stiffness.  Skin: Positive for color change. Negative for rash and wound.  Neurological: Negative for weakness, numbness and headaches.  Psychiatric/Behavioral: Negative for behavioral problems.      Allergies  Levaquin  Home Medications   No current outpatient prescriptions on file. BP 124/36  Pulse 56  Temp(Src) 99.4 F (37.4 C) (Oral)  Resp 18  Ht 4\' 11"  (1.499 m)  Wt 117 lb 3.2 oz (53.162 kg)  BMI 23.66 kg/m2  SpO2 97% Physical Exam  Constitutional: She appears well-developed and well-nourished.  Cardiovascular: Normal rate and regular rhythm.   Pulmonary/Chest: Effort normal and breath sounds normal.  There is a pacemaker over left upper chest.there is erythema overlying it with a fluctuant swollen area at the area of the scar. No drainage.  Abdominal: Soft. There is no tenderness.  Musculoskeletal: Normal range of motion.  Neurological: She is alert.    ED Course  Procedures (including critical care time) Labs Review Labs Reviewed  CBC WITH DIFFERENTIAL - Abnormal; Notable for the following:    RBC 3.86 (*)    HCT 35.6 (*)  RDW 15.6 (*)    All other components within normal limits  PROTIME-INR - Abnormal; Notable for the following:    Prothrombin Time 27.9 (*)    INR 2.72 (*)    All other components within normal limits  BASIC METABOLIC PANEL - Abnormal; Notable for the following:    GFR calc non Af Amer 48 (*)    GFR calc Af Amer 56 (*)    All other components within normal limits  BASIC METABOLIC PANEL - Abnormal; Notable for the following:    Sodium 135 (*)    Calcium 8.0 (*)    GFR calc non Af Amer 57 (*)     GFR calc Af Amer 66 (*)    All other components within normal limits  PROTIME-INR - Abnormal; Notable for the following:    Prothrombin Time 26.9 (*)    INR 2.59 (*)    All other components within normal limits  CBC - Abnormal; Notable for the following:    RBC 3.72 (*)    Hemoglobin 11.6 (*)    HCT 34.4 (*)    RDW 15.7 (*)    All other components within normal limits  PROTIME-INR - Abnormal; Notable for the following:    Prothrombin Time 22.1 (*)    INR 2.00 (*)    All other components within normal limits  PROTIME-INR - Abnormal; Notable for the following:    Prothrombin Time 22.2 (*)    INR 2.02 (*)    All other components within normal limits  PROTIME-INR - Abnormal; Notable for the following:    Prothrombin Time 15.4 (*)    All other components within normal limits  CULTURE, BLOOD (ROUTINE X 2)  CULTURE, BLOOD (ROUTINE X 2)  WOUND CULTURE  WOUND CULTURE  TYPE AND SCREEN  ABO/RH   Imaging Review Dg Chest 2 View  02/26/2014   CLINICAL DATA:  Pacemaker removal  EXAM: CHEST  2 VIEW  COMPARISON:  February 23, 2014  FINDINGS: Pacemaker has been removed. No pneumothorax. There is underlying emphysematous change. There is patchy lung scarring bilaterally, primarily in the left lower lobe. There is a small left effusion which is stable. There is no new opacity. Heart size is upper normal with normal pulmonary vascularity. There is atherosclerotic change in the aorta. No adenopathy. There is anterior wedging of a lower thoracic vertebral body, stable.  IMPRESSION: Pacemaker removed. No pneumothorax. Small left effusion, stable. Underlying emphysema with areas of scarring. No airspace consolidation.   Electronically Signed   By: Bretta Bang M.D.   On: 02/26/2014 07:31     EKG Interpretation None      MDM   Final diagnoses:  Infected pacemaker    Patient with likely abscess over pacemaker. D/w Dr [name]. Admit for IV abx. No I and D.    Juliet Rude. Rubin Payor, MD 02/26/14  1252

## 2014-02-23 NOTE — H&P (Signed)
[name] DresserBetty Daniels is an 80 y.o. female.    Chief Complaint: pacemaker infection Primary Cardiologist: Graciela HusbandsKlein HPI: [name] is an 80 yo woman with known PMH of atrial fibrillation that developed tachybrady syndrome s/p Medtronic PM 7/14, CKD, GERD, prior CVA, on chronic warfarin who has evidence of pacemaker infection leading to presentation. [name] tells me that the skin overlying her pacemaker developed redness and irritation over the last 24-48 hours with swelling yesterday without overt fever/chills. She has been compliant with her warfarin.  No fever/chills, no nausea/vomiting, no sick contacts, no other concerns. No issues with medications.    Past Medical History  Diagnosis Date  . Atrial fibrillation     a. Dx 12/2011. b. Adm at [facility] 2014 placed on sotalol but had bradycardia with this. c. Adm 06/2013 with AF-RVR - due to h/o bradycardia, underwent Medtronic ppm & amiodarone initiation (spont conv to NSR).   [name]. GERD (gastroesophageal reflux disease)   . Stroke, lacunar-identified by CT scanning 01/01/2012  . CHF (congestive heart failure)     a. [facility].  [name]. Pneumonia 06/2013  . Renal insufficiency     a. noted by labs 06/2013.    Past Surgical History  Procedure Laterality Date  . Tonsillectomy  ~ 1950  . Cataract extraction w/ intraocular lens implant Left 07/08/2013  . Pacemaker insertion  07-10-2013    MDT ADDRL1 pacemaker implanted by Dr Graciela HusbandsKlein for tachy-brady syndrome    No family history on file. Social History:  reports that she has never smoked. She has never used smokeless tobacco. She reports that she does not drink alcohol or use illicit drugs. No known family history of CAD  Allergies:  Allergies  Allergen Reactions  . Levaquin [Levofloxacin In D5w] Nausea And Vomiting     (Not in a hospital admission)  No results found for this or any previous visit (from the past 48 hour(s)). No results found.  Review of Systems    Constitutional: Negative for fever and chills.  HENT: Negative for ear pain.   Eyes: Negative for blurred vision and photophobia.  Respiratory: Negative for cough, hemoptysis and shortness of breath.   Cardiovascular: Negative for chest pain and palpitations.  Gastrointestinal: Negative for heartburn, nausea and vomiting.  Genitourinary: Negative for dysuria and hematuria.  Musculoskeletal: Negative for myalgias and neck pain.  Skin: Positive for rash.  Neurological: Negative for dizziness, tingling, tremors, weakness and headaches.  Endo/Heme/Allergies: Negative for polydipsia. Does not bruise/bleed easily.  Psychiatric/Behavioral: Negative for depression, suicidal ideas, hallucinations and substance abuse.    Blood pressure 158/47, pulse 60, temperature 98.1 F (36.7 C), temperature source Oral, resp. rate 24, SpO2 98.00%. Physical Exam  Nursing note and vitals reviewed. Constitutional: She is oriented to person, place, and time. She appears well-developed and well-nourished. No distress.  HENT:  Head: Normocephalic and atraumatic.  Nose: Nose normal.  Mouth/Throat: Oropharynx is clear and moist. No oropharyngeal exudate.  Eyes: Conjunctivae and EOM are normal. Pupils are equal, round, and reactive to light. No scleral icterus.  Neck: Normal range of motion. Neck supple. No JVD present. No tracheal deviation present. No thyromegaly present.  Cardiovascular: Normal rate, regular rhythm, normal heart sounds and intact distal pulses.  Exam reveals no gallop.   No murmur heard. Respiratory: Effort normal and breath sounds normal. No respiratory distress. She has no wheezes. She exhibits tenderness.  Left chest wall with erythematous, fluid filled pocket, tender  GI: Soft. Bowel sounds are normal. She  exhibits no distension. There is no tenderness.  Musculoskeletal: Normal range of motion. She exhibits no edema and no tenderness.  Neurological: She is alert and oriented to person,  place, and time. No cranial nerve deficit. Coordination normal.  Skin: Skin is warm and dry. No rash noted. She is not diaphoretic. No erythema.  Psychiatric: She has a normal mood and affect. Her behavior is normal. Thought content normal.    Labs pending at time of admission Last known Cr 1.1 10/14 7/14 echo from [place] with EF 55-60%, mild MR, mild PR  Problem List Pacemaker infection Atrial fibrillation on chronic anticoagulation with warfarin Diastolic heart failure - chronic Hypertension  Assessment/Plan [name] is an 80 yo woman with PMH of hypertension, diastolic HF and atrial fibrillation on warfarin who presents with 1-2 days of erythema and now swollen pocket over her pacemaker concerning for infection. She was initiated on vancomycin in the ER after blood cultures were drawn.  - admit to telemetry - blood cultures, pending, pharmacy to dose vancomycin - PA/Lateral Chest x-ray, ECG - cbc, bmp - NPO after MN for potential PM removal - holding warfarin until INR returns --> returned at 2.7, holding dose tonight  Lilburn Straw 02/23/2014, 5:36 PM

## 2014-02-23 NOTE — ED Notes (Signed)
MD at the bedside. MD drew marking to document erythema.

## 2014-02-24 ENCOUNTER — Encounter (HOSPITAL_COMMUNITY)
Admission: EM | Disposition: A | Discharge status: Home Health | Payer: Self-pay | Source: Home / Self Care | Attending: Internal Medicine

## 2014-02-24 ENCOUNTER — Encounter (HOSPITAL_COMMUNITY): Payer: Self-pay | Admitting: Internal Medicine

## 2014-02-24 DIAGNOSIS — T827XXA Infection and inflammatory reaction due to other cardiac and vascular devices, implants and grafts, initial encounter: Secondary | ICD-10-CM

## 2014-02-24 HISTORY — PX: POCKET EVACUATION: SHX5487

## 2014-02-24 LAB — BASIC METABOLIC PANEL
BUN: 13 mg/dL (ref 6–23)
CALCIUM: 8 mg/dL — AB (ref 8.4–10.5)
CO2: 25 mEq/L (ref 19–32)
Chloride: 98 mEq/L (ref 96–112)
Creatinine, Ser: 0.92 mg/dL (ref 0.50–1.10)
GFR calc non Af Amer: 57 mL/min — ABNORMAL LOW (ref >90)
GFR, EST AFRICAN AMERICAN: 66 mL/min — AB (ref >90)
GLUCOSE: 97 mg/dL (ref 70–99)
Potassium: 4.2 mEq/L (ref 3.7–5.3)
Sodium: 135 mEq/L — ABNORMAL LOW (ref 137–147)

## 2014-02-24 LAB — CBC
HEMATOCRIT: 34.4 % — AB (ref 36.0–46.0)
Hemoglobin: 11.6 g/dL — ABNORMAL LOW (ref 12.0–15.0)
MCH: 31.2 pg (ref 26.0–34.0)
MCHC: 33.7 g/dL (ref 30.0–36.0)
MCV: 92.5 fL (ref 78.0–100.0)
PLATELETS: 261 10*3/uL (ref 150–400)
RBC: 3.72 MIL/uL — ABNORMAL LOW (ref 3.87–5.11)
RDW: 15.7 % — AB (ref 11.5–15.5)
WBC: 7.9 10*3/uL (ref 4.0–10.5)

## 2014-02-24 LAB — PROTIME-INR
INR: 2.59 — ABNORMAL HIGH (ref 0.00–1.49)
INR: 2 — ABNORMAL HIGH (ref 0.00–1.49)
Prothrombin Time: 26.9 seconds — ABNORMAL HIGH (ref 11.6–15.2)
Prothrombin Time: 22.1 seconds — ABNORMAL HIGH (ref 11.6–15.2)

## 2014-02-24 SURGERY — POCKET EVACUATION
Anesthesia: LOCAL

## 2014-02-24 MED ORDER — SODIUM CHLORIDE 0.9 % IV SOLN
INTRAVENOUS | Status: DC
  Administered 2014-02-24: 14:00:00 via INTRAVENOUS

## 2014-02-24 MED ORDER — HEPARIN (PORCINE) IN NACL 2-0.9 UNIT/ML-% IJ SOLN
INTRAMUSCULAR | Status: AC
  Filled 2014-02-24: qty 500

## 2014-02-24 MED ORDER — CHLORHEXIDINE GLUCONATE 4 % EX LIQD: Freq: Once | CUTANEOUS | Status: AC

## 2014-02-24 MED ORDER — MIDAZOLAM HCL 5 MG/5ML IJ SOLN
INTRAMUSCULAR | Status: AC
  Filled 2014-02-24: qty 5

## 2014-02-24 MED ORDER — FENTANYL CITRATE 0.05 MG/ML IJ SOLN
INTRAMUSCULAR | Status: AC
  Filled 2014-02-24: qty 2

## 2014-02-24 MED ORDER — LIDOCAINE HCL (PF) 1 % IJ SOLN
INTRAMUSCULAR | Status: AC
  Filled 2014-02-24: qty 60

## 2014-02-24 MED ORDER — PHYTONADIONE 5 MG PO TABS
5.0000 mg | ORAL_TABLET | Freq: Once | ORAL | Status: AC
  Administered 2014-02-24: 5 mg via ORAL
  Filled 2014-02-24: qty 1

## 2014-02-24 MED ORDER — CHLORHEXIDINE GLUCONATE 4 % EX LIQD
Freq: Once | CUTANEOUS | Status: AC
  Administered 2014-02-24: 14:00:00 via TOPICAL
  Filled 2014-02-24: qty 60

## 2014-02-24 NOTE — Progress Notes (Signed)
Vit K given  Pt not device dependent with sinus rate at about 50 qwith intrinsic conduciton

## 2014-02-24 NOTE — CV Procedure (Signed)
[name] DresserBetty Daniels [id number]  [id number]  Preop Dx: incisional infection? Device involvement   Postop Dx same/device involvement  Procedure: exploration  Following sterile prep and drape, lidocaine was infiltrated into the incision and opened for abotu 3 cm.  Pus was expressed and as pressure was applied at the device body, pus was expressed through what turned out to be a fistula from the device pocket into the incision line ultimately , the device was visible. The pocket was irrigated, and loosely together were applied the skin edges with proline  She tolerated without difficulty  She will need the device removed in its entirety  Cx: None    Sherryl MangesSteven Klein, MD 02/24/2014 5:14 PM

## 2014-02-24 NOTE — Progress Notes (Signed)
Patient Name: [name] Daniels      SUBJECTIVE noted swelling at pacemaker site in the last few days  Has no sx of fever chills or systemic complaints  Last device check 1/15>> 98% Apacing and 94% Vpacing  Past Medical History  Diagnosis Date  . Atrial fibrillation     a. Dx 12/2011. b. Adm at [facility] 2014 placed on sotalol but had bradycardia with this. c. Adm 06/2013 with AF-RVR - due to h/o bradycardia, underwent Medtronic ppm & amiodarone initiation (spont conv to NSR).   [name]. GERD (gastroesophageal reflux disease)   . Stroke, lacunar-identified by CT scanning 01/01/2012  . CHF (congestive heart failure)     a. [facility] ~[facility].  [name]. Pneumonia 06/2013  . Renal insufficiency     a. noted by labs 06/2013.    Scheduled Meds:  Scheduled Meds: . amiodarone  200 mg Oral Daily  . atorvastatin  10 mg Oral QHS  . vitamin B-12  500 mcg Oral Daily  . ferrous gluconate  325 mg Oral Q breakfast  . furosemide  20 mg Oral Daily  . sodium chloride  3 mL Intravenous Q12H  . vancomycin  500 mg Intravenous Q24H   Continuous Infusions:   PHYSICAL EXAM Filed Vitals:   02/23/14 1751 02/23/14 1835 02/23/14 2100 02/24/14 0500  BP: 165/54 167/59 108/38 145/49  Pulse: 65 66 63 64  Temp:  98.7 F (37.1 C) 97.9 F (36.6 C) 97.9 F (36.6 C)  TempSrc:  Oral    Resp: 19 18 16 16   Height:  4\' 11"  (1.499 m)    Weight:  109 lb 9.6 oz (49.714 kg)  109 lb 8 oz (49.669 kg)  SpO2: 97% 95% 97% 94%    General appearance: alert, cooperative and no distress Lungs: clear to auscultation bilaterally Heart: regular rate and rhythm, S1, S2 normal, no murmur, click, rub or gallop Extremities: extremities normal, atraumatic, no cyanosis or edema Skin:  Abscess along incision line with cutanous erythema Neurologic: Alert and oriented X 3, normal strength and tone. Normal symmetric reflexes. Normal coordination and gait  TELEMETRY: Reviewed telemetry pt in av pacing   No intake  or output data in the 24 hours ending 02/24/14 1307  LABS: Basic Metabolic Panel:  Recent Labs Lab 02/23/14 1745 02/24/14 0505  NA 140 135*  K 4.1 4.2  CL 98 98  CO2 28 25  GLUCOSE 99 97  BUN 15 13  CREATININE 1.06 0.92  CALCIUM 8.5 8.0*   Cardiac Enzymes: No results found for this basename: CKTOTAL, CKMB, CKMBINDEX, TROPONINI,  in the last 72 hours CBC:  Recent Labs Lab 02/23/14 1745 02/24/14 0505  WBC 8.6 7.9  NEUTROABS 5.8  --   HGB 12.2 11.6*  HCT 35.6* 34.4*  MCV 92.2 92.5  PLT 267 261   PROTIME:  Recent Labs  02/23/14 1745 02/24/14 0505  LABPROT 27.9* 26.9*  INR 2.72* 2.59*   Liver Function Tests: No results found for this basename: AST, ALT, ALKPHOS, BILITOT, PROT, ALBUMIN,  in the last 72 hours No results found for this basename: LIPASE, AMYLASE,  in the last 72 hours BNP: BNP (last 3 results)  Recent Labs  07/09/13 1347  PROBNP 649.4*   D-Dimer: No results found for this basename: DDIMER,  in the last 72 hours Hemoglobin A1C: No results found for this basename: HGBA1C,  in the last 72 hours Fasting Lipid Panel: No results found for this basename:  CHOL, HDL, LDLCALC, TRIG, CHOLHDL, LDLDIRECT,  in the last 72 hours Thyroid Function Tests: No results found for this basename: TSH, T4TOTAL, FREET3, T3FREE, THYROIDAB,  in the last 72 hours Anemia Panel: No results found for this basename: VITAMINB12, FOLATE, FERRITIN, TIBC, IRON, RETICCTPCT,  in the last 72 hours   Device Interrogation: rfeviewd  Normal device function not dependent   ASSESSMENT AND PLAN:  Principal Problem:   Pacemaker infection Active Problems:   Atrial fibrillation   HTN (hypertension)   Chronic diastolic heart failure   Pacemaker-Medtronic  Pt with tachy brady syndrome with Medtronic dedvice  The infection appears very superficial  I have spoken with GT and I wonder if it may not be treatable with I and D and  Secondary intention healing  There is no evidence of  systemic infection   BC are pending We will explore the abscess and drain it, and if the floor in intact we will hope for healing and salvage of the device otherwise she will require an extraction   She and her daughter understand and agree  Signed, Sherryl Manges MD  02/24/2014

## 2014-02-25 ENCOUNTER — Encounter (HOSPITAL_COMMUNITY)
Admission: EM | Disposition: A | Discharge status: Home Health | Payer: Self-pay | Source: Home / Self Care | Attending: Internal Medicine

## 2014-02-25 DIAGNOSIS — I369 Nonrheumatic tricuspid valve disorder, unspecified: Secondary | ICD-10-CM

## 2014-02-25 DIAGNOSIS — T827XXA Infection and inflammatory reaction due to other cardiac and vascular devices, implants and grafts, initial encounter: Secondary | ICD-10-CM

## 2014-02-25 DIAGNOSIS — I4891 Unspecified atrial fibrillation: Secondary | ICD-10-CM

## 2014-02-25 DIAGNOSIS — I495 Sick sinus syndrome: Secondary | ICD-10-CM

## 2014-02-25 HISTORY — PX: PERMANENT PACEMAKER INSERTION: SHX5480

## 2014-02-25 HISTORY — PX: PACEMAKER REMOVAL: SHX5066

## 2014-02-25 LAB — TYPE AND SCREEN
ABO/RH(D): O POS
ANTIBODY SCREEN: NEGATIVE

## 2014-02-25 LAB — ABO/RH: ABO/RH(D): O POS

## 2014-02-25 LAB — PROTIME-INR
INR: 2.02 — ABNORMAL HIGH (ref 0.00–1.49)
Prothrombin Time: 22.2 seconds — ABNORMAL HIGH (ref 11.6–15.2)

## 2014-02-25 SURGERY — PERMANENT PACEMAKER INSERTION
Anesthesia: LOCAL

## 2014-02-25 MED ORDER — CHLORHEXIDINE GLUCONATE 4 % EX LIQD
60.0000 mL | Freq: Once | CUTANEOUS | Status: DC
Start: 2014-02-25 — End: 2014-02-25

## 2014-02-25 MED ORDER — HYDROCODONE-ACETAMINOPHEN 5-325 MG PO TABS: 1.0000 | ORAL_TABLET | ORAL | Status: DC | PRN

## 2014-02-25 MED ORDER — SODIUM CHLORIDE 0.9 % IR SOLN
80.0000 mg | Status: DC
  Filled 2014-02-25: qty 2

## 2014-02-25 MED ORDER — SODIUM CHLORIDE 0.9 % IV SOLN: 250.0000 mL | INTRAVENOUS | Status: DC | PRN

## 2014-02-25 MED ORDER — ONDANSETRON HCL 4 MG/2ML IJ SOLN: 4.0000 mg | Freq: Four times a day (QID) | INTRAMUSCULAR | Status: DC | PRN

## 2014-02-25 MED ORDER — VANCOMYCIN HCL IN DEXTROSE 1-5 GM/200ML-% IV SOLN: 1000.0000 mg | INTRAVENOUS | Status: DC

## 2014-02-25 MED ORDER — MIDAZOLAM HCL 5 MG/5ML IJ SOLN
INTRAMUSCULAR | Status: AC
  Filled 2014-02-25: qty 5

## 2014-02-25 MED ORDER — ACETAMINOPHEN 325 MG PO TABS: 325.0000 mg | ORAL_TABLET | ORAL | Status: DC | PRN

## 2014-02-25 MED ORDER — FENTANYL CITRATE 0.05 MG/ML IJ SOLN
INTRAMUSCULAR | Status: AC
  Filled 2014-02-25: qty 2

## 2014-02-25 MED ORDER — VANCOMYCIN HCL 500 MG IV SOLR
500.0000 mg | INTRAVENOUS | Status: AC
  Filled 2014-02-25: qty 500

## 2014-02-25 MED ORDER — CEFAZOLIN SODIUM 1-5 GM-% IV SOLN
INTRAVENOUS | Status: AC
Start: 2014-02-25 — End: 2014-02-25
  Filled 2014-02-25: qty 50

## 2014-02-25 MED ORDER — SODIUM CHLORIDE 0.9 % IJ SOLN
3.0000 mL | Freq: Two times a day (BID) | INTRAMUSCULAR | Status: DC
  Administered 2014-02-25 – 2014-02-27 (×4): 3 mL via INTRAVENOUS

## 2014-02-25 MED ORDER — CHLORHEXIDINE GLUCONATE 4 % EX LIQD: 60.0000 mL | Freq: Once | CUTANEOUS | Status: DC

## 2014-02-25 MED ORDER — LIDOCAINE HCL (PF) 1 % IJ SOLN
INTRAMUSCULAR | Status: AC
  Filled 2014-02-25: qty 60

## 2014-02-25 MED ORDER — SODIUM CHLORIDE 0.9 % IV SOLN
INTRAVENOUS | Status: DC
  Administered 2014-02-25: 08:00:00 via INTRAVENOUS

## 2014-02-25 MED ORDER — SODIUM CHLORIDE 0.9 % IJ SOLN: 3.0000 mL | INTRAMUSCULAR | Status: DC | PRN

## 2014-02-25 MED ORDER — PHENYLEPHRINE HCL 10 MG/ML IJ SOLN
INTRAMUSCULAR | Status: AC
  Filled 2014-02-25: qty 1

## 2014-02-25 NOTE — Care Management Note (Addendum)
  Page 2 of 2   02/28/2014     12:29:02 PM   CARE MANAGEMENT NOTE 02/28/2014  Patient:  [name] Daniels,[name]   Account Number:  [id number]  Date Initiated:  02/25/2014  Documentation initiated by:  Daniels,Marveen Donlon  Subjective/Objective Assessment:   Pt admitted for pacemaker infection. Initiated on IV vancomycin. 02-25-14 pt underwent pacemaker system removal.     Action/Plan:   CM will continue to monitor for disposition needs.   Anticipated DC Date:  02/27/2014   Anticipated DC Plan:  HOME W HOME HEALTH SERVICES      DC Planning Services  CM consult      [facility]  [facility]   Choice offered to / List presented to:  C-1 Patient        HH arranged  HH-1 RN  HH-10 DISEASE MANAGEMENT      HH agency  [facility]   Status of service:  Completed, signed off Medicare Important Message given?   (If response is "NO", the following Medicare IM given date fields will be blank) Date Medicare IM given:   Date Additional Medicare IM given:    Discharge Disposition:  HOME W HOME HEALTH SERVICES  Per UR Regulation:  Reviewed for med. necessity/level of care/duration of stay  If discussed at Long Length of Stay Meetings, dates discussed:    Comments:  02-28-14 1225 [name] BambergerBrenda Graves-Bigelow, RN,BSN [phone] [facility]. [facility] [employer] [facility]. However, face to face needs to be filled out. [facility]. SOC to begin within 24 hrs of d/c. PEr MD IV ABX total of 2 weeks and IV ABX initiated on 3-31-5. PER ID notes HH company can pull picc when IV abx complete. No further needs from CM at this time.   02-27-14 [address] [name] Daniels, [place] [phone]  Pt is from home alone, however she has a brother that stops by daily at 5:30 am and stays until 5:30 pm. Niece [name] BlaseDebbie states that pt will most likely stay @ nieces house when stable for d/c. [facility].  PPM  pocket infection s/p PPM system removal. Per MD notes- Pt receiving IV vancomycin as 1 of 2 blood cultures are positive for gram+ cocci in clusters. Plan for ID & GI consult. CM will continue to monitor for disposition needs.

## 2014-02-25 NOTE — Brief Op Note (Signed)
02/23/2014 - 02/25/2014  Successful pacemaker system removal  No early apparent complications

## 2014-02-25 NOTE — H&P (View-Only) (Signed)
SUBJECTIVE: The patient is doing well today.  At this time, she denies chest pain, shortness of breath, or any new concerns.  She is slightly confused this morning -   Pacemaker pocket explored yesterday with pus expressed - for device extraction today.  Blood cultures yesterday +gram positive cocci in clusters.  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   CURRENT MEDICATIONS: . amiodarone  200 mg Oral Daily  . atorvastatin  10 mg Oral QHS  . vitamin B-12  500 mcg Oral Daily  . ferrous gluconate  325 mg Oral Q breakfast  . furosemide  20 mg Oral Daily  . sodium chloride  3 mL Intravenous Q12H  . vancomycin  500 mg Intravenous Q24H   . sodium chloride 75 mL/hr at 02/24/14 1342    OBJECTIVE: Physical Exam: Filed Vitals:   02/24/14 0500 02/24/14 1838 02/24/14 2100 02/25/14 0500  BP: 145/49 150/53 118/42 124/41  Pulse: 64 73 63 64  Temp: 97.9 F (36.6 C) 97.9 F (36.6 C) 98 F (36.7 C) 98.2 F (36.8 C)  TempSrc:      Resp: 16 16 16 16   Height:      Weight: 109 lb 8 oz (49.669 kg)   113 lb 12.8 oz (51.619 kg)  SpO2: 94% 96% 97% 94%    Intake/Output Summary (Last 24 hours) at 02/25/14 0630 Last data filed at 02/25/14 [id number]  Gross per 24 hour  Intake 1542.5 ml  Output      0 ml  Net 1542.5 ml    Telemetry reveals atrial pacing with intrinsic ventricular conduction, intermittent ventricular pacing  GEN- The patient is elderly and thin appearing, alert and oriented x 3 today.   Head- normocephalic, atraumatic Eyes-  Sclera clear, conjunctiva pink Ears- hearing intact Oropharynx- clear Neck- supple  Lungs- Clear to ausculation bilaterally, normal work of breathing Heart- Regular rate and rhythm  GI- soft, NT, ND, + BS Extremities- no clubbing, cyanosis, or edema Skin- dressing over her pacemaker site is in place Psych- euthymic mood, full affect Neuro- strength and sensation are intact  LABS: Basic Metabolic Panel:  Recent Labs  62/13/802/01/09 1745  02/24/14 0505  NA 140 135*  K 4.1 4.2  CL 98 98  CO2 28 25  GLUCOSE 99 97  BUN 15 13  CREATININE 1.06 0.92  CALCIUM 8.5 8.0*   CBC:  Recent Labs  02/23/14 1745 02/24/14 0505  WBC 8.6 7.9  NEUTROABS 5.8  --   HGB 12.2 11.6*  HCT 35.6* 34.4*  MCV 92.2 92.5  PLT 267 261   INR 2.02  RADIOLOGY: X-ray Chest Pa And Lateral  02/23/2014   CLINICAL DATA:  Pacemaker infection.  Evaluate pacemaker leads.  EXAM: CHEST  2 VIEW  COMPARISON:  09/11/2013  FINDINGS: Sequential pacemaker leads project within the right atrium right ventricle, stable from the prior study.  Lungs are hyperexpanded. There is upper lobe scarring. Chronic opacity is noted at the left lung base consistent with a combination of a small chronic effusion with atelectasis/scarring. No convincing infiltrate. No pulmonary edema.  Cardiac silhouette is mildly enlarged. Mediastinum is normal in contour.  IMPRESSION: 1. No acute cardiopulmonary disease 2. Pacer leads are well positioned and stable from the prior study.   Electronically Signed   By: Amie Portlandavid  Ormond M.D.   On: 02/23/2014 21:53    ASSESSMENT AND PLAN:  Principal Problem:   Pacemaker infection Active Problems:   Atrial fibrillation   HTN (hypertension)   Chronic diastolic heart  failure   Pacemaker-Medtronic  1. Pacemaker pocket infection I agree with Dr [name] that device/ lead removal is necessary at this time.  Risks, benefits, and alternatives to pacemaker system removal were discussed at length with the patient and her family today.  They understand that the risks include but are not limited to bleeding,  pneumothorax, perforation, tamponade, vascular damage, renal failure, MI, stroke, and death and wish to proceed. We will therefore schedule the procedure at the next available time.  2. afib Maintaining sinus rhythm with amiodarone Coumadin is on hold Per device interrogation, no afib since 10/14  3. Tachy/brady Presently underlying rhythm is sinus rhythm  50s.  As she has not had further afib with RVR since starting amiodarone, perhaps we can avoid PPM reimplantation in the future.

## 2014-02-25 NOTE — Progress Notes (Signed)
UR Completed Jullian Clayson Graves-Bigelow, RN,BSN [phone]  

## 2014-02-25 NOTE — Progress Notes (Signed)
Nutrition Brief Note  Patient identified on the Malnutrition Screening Tool (MST) Report  Wt Readings from Last 15 Encounters:  02/25/14 113 lb 12.8 oz (51.619 kg)  02/25/14 113 lb 12.8 oz (51.619 kg)  02/25/14 113 lb 12.8 oz (51.619 kg)  10/18/13 116 lb (52.617 kg)  08/27/13 114 lb (51.71 kg)  07/12/13 113 lb 8.6 oz (51.5 kg)  07/12/13 113 lb 8.6 oz (51.5 kg)  07/12/13 113 lb 8.6 oz (51.5 kg)  07/12/13 113 lb 8.6 oz (51.5 kg)  07/02/13 116 lb (52.617 kg)  11/19/12 116 lb (52.617 kg)  05/08/12 117 lb 4 oz (53.184 kg)  01/30/12 121 lb (54.885 kg)  01/10/12 119 lb (53.978 kg)  01/03/12 124 lb 1.9 oz (56.3 kg)   Patient with infected pacemaker. Plan to remove device. Patient reports that her PO intake is good, no changes in appetite and no weight change.   Body mass index is 22.97 kg/(m^2). Patient meets criteria for normal weight based on current BMI.   Current diet order is NPO (for procedure, was Heart Healthy), patient is consuming approximately 75% of meals at this time. Labs and medications reviewed.   No nutrition interventions warranted at this time. If nutrition issues arise, please consult RD.   Linnell FullingElyse Jayma Volpi, RD, LDN Pager #: [phone] After-Hours Pager #: [phone]

## 2014-02-25 NOTE — Progress Notes (Signed)
  Echocardiogram 2D Echocardiogram has been performed.  Arvil ChacoFoster, Seung Nidiffer 02/25/2014, 4:54 PM

## 2014-02-25 NOTE — Interval H&P Note (Signed)
History and Physical Interval Note:  02/25/2014 12:29 PM  [name] Daniels  has presented today for surgery, with the diagnosis of cp  The various methods of treatment have been discussed with the patient and family. After consideration of risks, benefits and other options for treatment, the patient has consented to pacemaker system removal as a surgical intervention .  The patient's history has been reviewed, patient examined, no change in status, stable for surgery.  I have reviewed the patient's chart and labs.  Questions were answered to the patient's satisfaction.     Hillis RangeJames Garan Frappier

## 2014-02-25 NOTE — Progress Notes (Signed)
 SUBJECTIVE: The patient is doing well today.  At this time, she denies chest pain, shortness of breath, or any new concerns.  She is slightly confused this morning -   Pacemaker pocket explored yesterday with pus expressed - for device extraction today.  Blood cultures yesterday +gram positive cocci in clusters.  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   CURRENT MEDICATIONS: . amiodarone  200 mg Oral Daily  . atorvastatin  10 mg Oral QHS  . vitamin B-12  500 mcg Oral Daily  . ferrous gluconate  325 mg Oral Q breakfast  . furosemide  20 mg Oral Daily  . sodium chloride  3 mL Intravenous Q12H  . vancomycin  500 mg Intravenous Q24H   . sodium chloride 75 mL/hr at 02/24/14 1342    OBJECTIVE: Physical Exam: Filed Vitals:   02/24/14 0500 02/24/14 1838 02/24/14 2100 02/25/14 0500  BP: 145/49 150/53 118/42 124/41  Pulse: 64 73 63 64  Temp: 97.9 F (36.6 C) 97.9 F (36.6 C) 98 F (36.7 C) 98.2 F (36.8 C)  TempSrc:      Resp: 16 16 16 16  Height:      Weight: 109 lb 8 oz (49.669 kg)   113 lb 12.8 oz (51.619 kg)  SpO2: 94% 96% 97% 94%    Intake/Output Summary (Last 24 hours) at 02/25/14 0630 Last data filed at 02/25/14 0512  Gross per 24 hour  Intake 1542.5 ml  Output      0 ml  Net 1542.5 ml    Telemetry reveals atrial pacing with intrinsic ventricular conduction, intermittent ventricular pacing  GEN- The patient is elderly and thin appearing, alert and oriented x 3 today.   Head- normocephalic, atraumatic Eyes-  Sclera clear, conjunctiva pink Ears- hearing intact Oropharynx- clear Neck- supple  Lungs- Clear to ausculation bilaterally, normal work of breathing Heart- Regular rate and rhythm  GI- soft, NT, ND, + BS Extremities- no clubbing, cyanosis, or edema Skin- dressing over her pacemaker site is in place Psych- euthymic mood, full affect Neuro- strength and sensation are intact  LABS: Basic Metabolic Panel:  Recent Labs  02/23/14 1745  02/24/14 0505  NA 140 135*  K 4.1 4.2  CL 98 98  CO2 28 25  GLUCOSE 99 97  BUN 15 13  CREATININE 1.06 0.92  CALCIUM 8.5 8.0*   CBC:  Recent Labs  02/23/14 1745 02/24/14 0505  WBC 8.6 7.9  NEUTROABS 5.8  --   HGB 12.2 11.6*  HCT 35.6* 34.4*  MCV 92.2 92.5  PLT 267 261   INR 2.02  RADIOLOGY: X-ray Chest Pa And Lateral  02/23/2014   CLINICAL DATA:  Pacemaker infection.  Evaluate pacemaker leads.  EXAM: CHEST  2 VIEW  COMPARISON:  09/11/2013  FINDINGS: Sequential pacemaker leads project within the right atrium right ventricle, stable from the prior study.  Lungs are hyperexpanded. There is upper lobe scarring. Chronic opacity is noted at the left lung base consistent with a combination of a small chronic effusion with atelectasis/scarring. No convincing infiltrate. No pulmonary edema.  Cardiac silhouette is mildly enlarged. Mediastinum is normal in contour.  IMPRESSION: 1. No acute cardiopulmonary disease 2. Pacer leads are well positioned and stable from the prior study.   Electronically Signed   By: David  Ormond M.D.   On: 02/23/2014 21:53    ASSESSMENT AND PLAN:  Principal Problem:   Pacemaker infection Active Problems:   Atrial fibrillation   HTN (hypertension)   Chronic diastolic heart   failure   Pacemaker-Medtronic  1. Pacemaker pocket infection I agree with Dr Klein that device/ lead removal is necessary at this time.  Risks, benefits, and alternatives to pacemaker system removal were discussed at length with the patient and her family today.  They understand that the risks include but are not limited to bleeding,  pneumothorax, perforation, tamponade, vascular damage, renal failure, MI, stroke, and death and wish to proceed. We will therefore schedule the procedure at the next available time.  2. afib Maintaining sinus rhythm with amiodarone Coumadin is on hold Per device interrogation, no afib since 10/14  3. Tachy/brady Presently underlying rhythm is sinus rhythm  50s.  As she has not had further afib with RVR since starting amiodarone, perhaps we can avoid PPM reimplantation in the future. 

## 2014-02-25 NOTE — Progress Notes (Signed)
Lab called at 2017 with positive blood cultures. Gram positive cocci in clusters were seen in the aerobic bottle.  MD on call notified at 2020. No new orders at this time.  Will continue to monitor.

## 2014-02-26 ENCOUNTER — Encounter (HOSPITAL_COMMUNITY)
Admission: EM | Disposition: A | Discharge status: Home Health | Payer: Self-pay | Source: Home / Self Care | Attending: Internal Medicine

## 2014-02-26 ENCOUNTER — Encounter (HOSPITAL_COMMUNITY): Payer: Self-pay | Admitting: *Deleted

## 2014-02-26 ENCOUNTER — Inpatient Hospital Stay (HOSPITAL_COMMUNITY): Payer: Medicare HMO

## 2014-02-26 HISTORY — PX: TEE WITHOUT CARDIOVERSION: SHX5443

## 2014-02-26 LAB — PROTIME-INR
INR: 1.25 (ref 0.00–1.49)
Prothrombin Time: 15.4 seconds — ABNORMAL HIGH (ref 11.6–15.2)

## 2014-02-26 SURGERY — ECHOCARDIOGRAM, TRANSESOPHAGEAL
Anesthesia: Moderate Sedation

## 2014-02-26 MED ORDER — LIDOCAINE VISCOUS 2 % MT SOLN
OROMUCOSAL | Status: DC | PRN
  Administered 2014-02-26: 15 mL via OROMUCOSAL

## 2014-02-26 MED ORDER — FENTANYL CITRATE 0.05 MG/ML IJ SOLN
INTRAMUSCULAR | Status: DC | PRN
  Administered 2014-02-26: 12.5 ug via INTRAVENOUS

## 2014-02-26 MED ORDER — FENTANYL CITRATE 0.05 MG/ML IJ SOLN
INTRAMUSCULAR | Status: AC
Start: 2014-02-26 — End: 2014-02-26
  Filled 2014-02-26: qty 2

## 2014-02-26 MED ORDER — BUTAMBEN-TETRACAINE-BENZOCAINE 2-2-14 % EX AERO
INHALATION_SPRAY | CUTANEOUS | Status: DC | PRN
  Administered 2014-02-26: 2 via TOPICAL

## 2014-02-26 MED ORDER — MIDAZOLAM HCL 5 MG/ML IJ SOLN
INTRAMUSCULAR | Status: AC
  Filled 2014-02-26: qty 2

## 2014-02-26 MED ORDER — MIDAZOLAM HCL 10 MG/2ML IJ SOLN
INTRAMUSCULAR | Status: DC | PRN
  Administered 2014-02-26 (×3): 1 mg via INTRAVENOUS

## 2014-02-26 MED ORDER — LIDOCAINE VISCOUS 2 % MT SOLN
OROMUCOSAL | Status: AC
  Filled 2014-02-26: qty 15

## 2014-02-26 NOTE — H&P (Signed)
     INTERVAL PROCEDURE H&P  History and Physical Interval Note:  02/26/2014 8:02 AM  [name] DresserBetty Daniels has presented today for their planned procedure. The various methods of treatment have been discussed with the patient and family. After consideration of risks, benefits and other options for treatment, the patient has consented to the procedure.  The patients' outpatient history has been reviewed, patient examined, and no change in status from most recent office note within the past 30 days. I have reviewed the patients' chart and labs and will proceed as planned. Questions were answered to the patient's satisfaction.   Chrystie NoseKenneth C. Hilty, MD, [facility]ACC Attending Cardiologist [facility]  HILTY,Kenneth C 02/26/2014, 8:02 AM

## 2014-02-26 NOTE — Op Note (Signed)
[name] Daniels, IGNASIAK NO.:  [id number]  MEDICAL RECORD NO.:  [id number]  LOCATION:  [id number]                        FACILITY:  [facility]  PHYSICIAN:  Hillis Range, MD       DATE OF BIRTH:  1930-10-23  DATE OF PROCEDURE: DATE OF DISCHARGE:                              OPERATIVE REPORT   SURGEON:  Hillis Range, MD  PREPROCEDURE DIAGNOSIS:  Pacemaker pocket infection.  POSTPROCEDURE DIAGNOSIS:  Pacemaker pocket infection.  PROCEDURES:  Pacemaker system removal with removal of the pulse generator as well as the atrial and ventricular leads.  INTRODUCTION:  [name] Daniels is a pleasant 80 year old female with a history of atrial fibrillation and tachycardia- bradycardia syndrome. She underwent pacemaker implantation by Dr. [name] on July 10, 2013.  She was noted previous to this to have tachycardia with AFib as well as bradycardia at times requiring pacing backup.  Since being placed on amiodarone, her device has detected no further atrial fibrillation and she has done quite well.  She is not pacemaker dependent.  Recently, she presented to Dr. Odessa Fleming office with fluctuance within her device pocket.  She therefore presented to [facility] and underwent pacemaker pocket exploration by Dr. [name] on February 24, 2014.  This demonstrated significant purulence within the pocket.  A wound culture was sent and the pocket was debrided.  The pocket was closed with plans for the patient to return today for pacemaker removal.  DESCRIPTION OF PROCEDURE:  Informed written consent was obtained, and the patient was brought to the electrophysiology lab in the fasting state.  She was adequately sedated with intravenous Versed and fentanyl as outlined in the nursing report.  The skin overlying the existing pacemaker pocket was infiltrated with lidocaine for local analgesia. Three separate Prolene sutures were identified which Dr. [name] had used to secure the pocket the previous day.  These were  removed and the pocket was easily opened.  The pacemaker was removed from the pocket.  A single silk suture was identified and removed.  The device was confirmed to be a Medtronic model ADDRL1 (serial [id number]) device implanted on July 10, 2013.  It was disconnected from the leads.  Stylettes were placed within the atrial and ventricular leads.  The atrial lead was a [employer], model 1944 (serial [id number]) lead and the ventricular lead was a [employer], model 1948 (serial [id number]) lead.  Both were implanted on July 10, 2013.  The suture sleeves were identified and removed.  All silk suture was removed from the pocket.  Using manual dissection technique, the leads were freed within the pocket.  With fluoroscopic visualization, each lead was gently removed from the body. The atrial lead was initially removed with gentle manual traction without any difficulty.  The ventricular lead was then removed with gentle manual traction.  Following lead removal, the patient remained clinically stable.  She was observed without symptoms or  hemodynamic instability.  The pocket was then debrided further.  The pocket was irrigated with copious gentamicin solution.  The pocket was then packed with iodoform gauze.  Two separate Prolene sutures were then placed in an interrupted fashion.  The procedure was therefore considered  completed.  There were no early apparent complications.  A limited bedside transthoracic echocardiogram revealed no pericardial effusion.  CONCLUSIONS: 1. Successful pacemaker system removal for pacemaker pocket infection. 2. No early apparent complications.     Hillis RangeJames Ailine Hefferan, MD     JA/MEDQ  D:  02/25/2014  T:  02/26/2014  Job:  [id number]  cc:   Duke SalviaSteven C. Klein, MD, [facility] CenterFACC

## 2014-02-26 NOTE — Progress Notes (Signed)
Patient: [name] Daniels Date of Encounter: 02/26/2014, 8:33 AM Admit date: 02/23/2014     Subjective  [name] Daniels has no complaints this AM. She denies CP, SOB or palpitations.    Objective  Physical Exam: Vitals: BP 143/68  Pulse 63  Temp(Src) 98.5 F (36.9 C) (Oral)  Resp 18  Ht 4\' 11"  (1.499 m)  Wt 117 lb 3.2 oz (53.162 kg)  BMI 23.66 kg/m2  SpO2 91% General: Well developed, well appearing 80 year old female in no acute distress. Neck: Supple. JVD not elevated. Lungs: Clear bilaterally to auscultation without wheezes, rales, or rhonchi. Breathing is unlabored. Heart: RRR S1 S2 without murmurs, rubs, or gallops.  Abdomen: Soft, non-distended. Extremities: No clubbing or cyanosis. No edema.  Distal pedal pulses are 2+ and equal bilaterally. Neuro: Alert and oriented X 3. Moves all extremities spontaneously. No focal deficits. Skin: Left upper chest intact without significant bleeding or hematoma.  Intake/Output:  Intake/Output Summary (Last 24 hours) at 02/26/14 1610 Last data filed at 02/25/14 2125  Gross per 24 hour  Intake    280 ml  Output      0 ml  Net    280 ml    Inpatient Medications:  . amiodarone  200 mg Oral Daily  . vitamin B-12  500 mcg Oral Daily  . ferrous gluconate  325 mg Oral Q breakfast  . furosemide  20 mg Oral Daily  . sodium chloride  3 mL Intravenous Q12H  . vancomycin  500 mg Intravenous Q24H    Labs:  Recent Labs  02/23/14 1745 02/24/14 0505  NA 140 135*  K 4.1 4.2  CL 98 98  CO2 28 25  GLUCOSE 99 97  BUN 15 13  CREATININE 1.06 0.92  CALCIUM 8.5 8.0*    Recent Labs  02/23/14 1745 02/24/14 0505  WBC 8.6 7.9  NEUTROABS 5.8  --   HGB 12.2 11.6*  HCT 35.6* 34.4*  MCV 92.2 92.5  PLT 267 261    Recent Labs  02/25/14 0247  INR 2.02*    Radiology/Studies: Dg Chest 2 View  02/26/2014   CLINICAL DATA:  Pacemaker removal  EXAM: CHEST  2 VIEW  COMPARISON:  February 23, 2014  FINDINGS: Pacemaker has been removed. No  pneumothorax. There is underlying emphysematous change. There is patchy lung scarring bilaterally, primarily in the left lower lobe. There is a small left effusion which is stable. There is no new opacity. Heart size is upper normal with normal pulmonary vascularity. There is atherosclerotic change in the aorta. No adenopathy. There is anterior wedging of a lower thoracic vertebral body, stable.  IMPRESSION: Pacemaker removed. No pneumothorax. Small left effusion, stable. Underlying emphysema with areas of scarring. No airspace consolidation.   Electronically Signed   By: Bretta Bang M.D.   On: 02/26/2014 07:31   X-ray Chest Pa And Lateral   02/23/2014   CLINICAL DATA:  Pacemaker infection.  Evaluate pacemaker leads.  EXAM: CHEST  2 VIEW  COMPARISON:  09/11/2013  FINDINGS: Sequential pacemaker leads project within the right atrium right ventricle, stable from the prior study.  Lungs are hyperexpanded. There is upper lobe scarring. Chronic opacity is noted at the left lung base consistent with a combination of a small chronic effusion with atelectasis/scarring. No convincing infiltrate. No pulmonary edema.  Cardiac silhouette is mildly enlarged. Mediastinum is normal in contour.  IMPRESSION: 1. No acute cardiopulmonary disease 2. Pacer leads are well positioned and stable from the prior study.  Electronically Signed   By: Amie Portlandavid  Ormond M.D.   On: 02/23/2014 21:53    Echocardiogram Study Conclusions - Left ventricle: The cavity size was normal. Systolic function was normal. The estimated ejection fraction was in the range of 55% to 60%. Features are consistent with a pseudonormal left ventricular filling pattern, with concomitant abnormal relaxation and increased filling pressure (grade 2 diastolic dysfunction). - Aortic valve: Diffuse thickening and calcification, consistent with sclerosis. - Mitral valve: The MV leaflets appear thickened extending into the chordae tendinae which are also  thickened and calcified There is heavy calcification that jets out into the LV cavity and seems to eminate from the interventricualr septum. This most likely represents a calcified papillary muscle since there appears to be calcified chordae tendinae off the MV leaftets in the vicinity of this. There are no parasternal views to adequately vsiualize the papillary muscle in short axis.If endocarditis if of concern recommend TEE to further evaluate - Right atrium: The atrium was mildly dilated. - Tricuspid valve: Mild-moderate regurgitation. - Pulmonary arteries: PA peak pressure: 50mm Hg (S). Impressions: - The right ventricular systolic pressure was increased consistent with mild pulmonary hypertension.  Telemetry: SR   Assessment and Plan  1. 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 Tachy-brady syndrome s/p PPM implant July 2014 3. PAF [name] is doing well post PPM system removal yesterday. Pocket is intact without significant bleeding or hematoma. She is afebrile. She is receiving IV vancomycin as 1 of 2 blood cultures are positive for gram+ cocci in clusters. She is scheduled for TEE today.  Signed, EDMISTEN, BROOKE PA-C  Unable to pass scope I have asked ID to help with choice and duration ofABX  The wound cultures are neg post ABX  Continue vanc for now

## 2014-02-26 NOTE — OR Nursing (Signed)
Dr. Rennis GoldenHilty was unable to pass the TEE probe,  Suctioning small amount of Bright red blood.  Upon examination with a flash light by Dr. Rennis GoldenHilty pt had some soft palate trama from the probe.  TEE aborted

## 2014-02-26 NOTE — CV Procedure (Signed)
    TRANSESOPHAGEAL ECHOCARDIOGRAM (TEE) NOTE  INDICATIONS: infective endocarditis  PROCEDURE:   Informed consent was obtained prior to the procedure. The risks, benefits and alternatives for the procedure were discussed and the patient comprehended these risks.  Risks include, but are not limited to, cough, sore throat, vomiting, nausea, somnolence, esophageal and stomach trauma or perforation, bleeding, low blood pressure, aspiration, pneumonia, infection, trauma to the teeth and death.    After a procedural time-out, the patient was given 3 mg versed and 12.5 mcg fentanyl for moderate sedation.  The oropharynx was anesthetized 10 cc of topical 1% viscous lidocaine and 2 cetacaine sprays.  The transesophageal probe was inserted in the esophagus and stomach without difficulty and multiple views were obtained.  The patient was kept under observation until the patient left the procedure room.  The patient left the procedure room in stable condition.   Agitated microbubble saline contrast was not administered.  COMPLICATIONS:    There was considerable upper esophageal resistance to probe passage. Multiple positions, angles, jaw thrusts and attempts were made. A small amount of mucosal bleeding was noted from the soft palate. A pediatric probe was not available. She was adequately sedated, however, the probe could not be passed. She did report prior to the procedure that she has trouble swallowing pills and was noted to cough recently after eating. She says that she does seem to get solid foods down.  Findings: None  IMPRESSION:   1. Unsuccessful TEE with inability to intubate the esophagus.   RECOMMENDATIONS:    1. Consider upper GI evaluation - given swallowing history, cough - ?Zenker's diverticulum. 2. Awaiting speciation and growth of 1/2 + blood cultures - this will help determine if contaminant or not. 2D echo does not suggest endocarditis. Her pacer hardware has been removed. The  pacer pocket culture from 3/2 shows no organisms or WBC's.  Time Spent Directly with the Patient:  60 minutes    Chrystie NoseKenneth C. Hilty, MD, [facility]ACC Attending Cardiologist [facility]  02/26/2014, 10:03 AM

## 2014-02-27 ENCOUNTER — Encounter (HOSPITAL_COMMUNITY): Payer: Self-pay | Admitting: Internal Medicine

## 2014-02-27 DIAGNOSIS — Y849 Medical procedure, unspecified as the cause of abnormal reaction of the patient, or of later complication, without mention of misadventure at the time of the procedure: Secondary | ICD-10-CM

## 2014-02-27 LAB — WOUND CULTURE
CULTURE: NO GROWTH
Gram Stain: NONE SEEN

## 2014-02-27 LAB — CULTURE, BLOOD (ROUTINE X 2)

## 2014-02-27 LAB — PROTIME-INR
INR: 1.14 (ref 0.00–1.49)
PROTHROMBIN TIME: 14.4 s (ref 11.6–15.2)

## 2014-02-27 LAB — VANCOMYCIN, TROUGH: VANCOMYCIN TR: 6.9 ug/mL — AB (ref 10.0–20.0)

## 2014-02-27 MED ORDER — VANCOMYCIN HCL IN DEXTROSE 750-5 MG/150ML-% IV SOLN
750.0000 mg | INTRAVENOUS | Status: DC
  Administered 2014-02-28: 750 mg via INTRAVENOUS
  Filled 2014-02-27 (×2): qty 150

## 2014-02-27 MED ORDER — WARFARIN - PHARMACIST DOSING INPATIENT
Freq: Every day | Status: DC
  Administered 2014-02-27: 18:00:00

## 2014-02-27 MED ORDER — WARFARIN SODIUM 2 MG PO TABS
2.0000 mg | ORAL_TABLET | Freq: Once | ORAL | Status: AC
  Administered 2014-02-27: 2 mg via ORAL
  Filled 2014-02-27: qty 1

## 2014-02-27 NOTE — Progress Notes (Signed)
ANTICOAGULATION CONSULT NOTE - Initial Consult  Pharmacy Consult for couamdin Indication: atrial fibrillation  Allergies  Allergen Reactions  . Levaquin [Levofloxacin In D5w] Nausea And Vomiting    Patient Measurements: Height: 4\' 11"  (149.9 cm) Weight: 116 lb 13.1 oz (52.988 kg) IBW/kg (Calculated) : 43.2   Vital Signs:    Labs:  Recent Labs  02/25/14 0247 02/26/14 0500 02/27/14 0342  LABPROT 22.2* 15.4* 14.4  INR 2.02* 1.25 1.14    Estimated Creatinine Clearance: 35.7 ml/min (by C-G formula based on Cr of 0.92).   Medical History: Past Medical History  Diagnosis Date  . Atrial fibrillation     a. Dx 12/2011. b. Adm at [facility] 2014 placed on sotalol but had bradycardia with this. c. Adm 06/2013 with AF-RVR - due to h/o bradycardia, underwent Medtronic ppm & amiodarone initiation (spont conv to NSR).   [name]. GERD (gastroesophageal reflux disease)   . Stroke, lacunar-identified by CT scanning 01/01/2012  . CHF (congestive heart failure)     a. [facility].  [name]. Pneumonia 06/2013  . Renal insufficiency     a. noted by labs 06/2013.  . Bacteremia 02/23/2014    gram positive cocci in clusters  . Chronotropic incompetence with sinus node dysfunction 10/18/2013    Medications:  Prescriptions prior to admission  Medication Sig Dispense Refill  . amiodarone (PACERONE) 200 MG tablet Take 1 tablet (200 mg total) by mouth daily.  90 tablet  2  . atorvastatin (LIPITOR) 20 MG tablet Take 10 mg by mouth at bedtime.   30 tablet  11  . ferrous gluconate (FERGON) 325 MG tablet Take 325 mg by mouth daily with breakfast.      . Ferrous Sulfate (IRON) 325 (65 FE) MG TABS Take 65 mg by mouth daily.      . furosemide (LASIX) 40 MG tablet 20 mg daily.      [name]. lisinopril (PRINIVIL,ZESTRIL) 10 MG tablet 10 mg daily.      . vitamin B-12 (CYANOCOBALAMIN) 500 MCG tablet Take 500 mcg by mouth daily.      [name]. warfarin (COUMADIN) 2 MG tablet Take 2 mg by mouth daily.         Assessment: 80 yo F known to pharmacy from vancomycin protocol.  Her home dose of couamadin is 2 mg daily.  Her admission INR was therapeutic at 2.72 on 2 mg daily.  She was given vitamin K 5 mg po on 3/2 to reverse coumadin to removed infected pacemaker.  She had an unsuccessful TEE 3/4 due to unable to pass the probe down her throat.  There was a small amount of mucosal bleeding from the soft palate due to trauma and a small amount of bright red blood was suctioned. Pharmacy has been consulted to resume her coumadin today for afib with specific instructions to resume home dose and allow INR to slowly increase. INR is 1.14 today after reversal with vitamin K.  On amiodarone 200 mg qday.  MD concerned about possibility of pacemaker pocket site hematoma.   Goal of Therapy:  INR 2-3   Plan:  1. Resume home dose of coumadin 2 mg qday and allow INR to slowly increase per MD reqeust. 2. Daily INR  Herby AbrahamMichelle T. Enrique Manganaro, Pharm.D. [phone] 02/27/2014 3:38 PM

## 2014-02-27 NOTE — Progress Notes (Addendum)
Discussed case with GI, Lianne BushySarah Gribbon, PA-C and have requested GI consultation. Will order esophagram / barium swallow study as per their request - question stricture versus spasm. Ordered NPO after midnight for esophagram tomorrow.

## 2014-02-27 NOTE — Progress Notes (Addendum)
Patient: [name] Daniels Date of Encounter: 02/27/2014, 9:23 AM Admit date: 02/23/2014     Subjective  [name] Daniels has no complaints this AM. She denies CP, SOB or palpitations.    Objective  Physical Exam: Vitals: BP 127/40  Pulse 67  Temp(Src) 99.1 F (37.3 C) (Oral)  Resp 18  Ht 4\' 11"  (1.499 m)  Wt 116 lb 13.1 oz (52.988 kg)  BMI 23.58 kg/m2  SpO2 93% General: Well developed, well appearing 80 year old female in no acute distress. Neck: Supple. JVD not elevated. Lungs: Clear bilaterally to auscultation without wheezes, rales, or rhonchi. Breathing is unlabored. Heart: RRR S1 S2 without murmurs, rubs, or gallops.  Abdomen: Soft, non-distended. Extremities: No clubbing or cyanosis. No edema.  Distal pedal pulses are 2+ and equal bilaterally. Neuro: Alert and oriented X 3. Moves all extremities spontaneously. No focal deficits. Skin: Left upper chest intact without significant bleeding or hematoma.  Mild amount of purulent drainage expressed from pocket and iodoform gauze 4 inches removed.  Intake/Output: No intake or output data in the 24 hours ending 02/27/14 6045  Inpatient Medications:  . amiodarone  200 mg Oral Daily  . vitamin B-12  500 mcg Oral Daily  . ferrous gluconate  325 mg Oral Q breakfast  . furosemide  20 mg Oral Daily  . sodium chloride  3 mL Intravenous Q12H  . vancomycin  500 mg Intravenous Q24H    Labs:    Component Value Date/Time   WBC 7.9 02/24/2014 0505   RBC 3.72* 02/24/2014 0505   HGB 11.6* 02/24/2014 0505   HCT 34.4* 02/24/2014 0505   PLT 261 02/24/2014 0505   MCV 92.5 02/24/2014 0505   MCH 31.2 02/24/2014 0505   MCHC 33.7 02/24/2014 0505   RDW 15.7* 02/24/2014 0505   LYMPHSABS 1.7 02/23/2014 1745   MONOABS 1.0 02/23/2014 1745   EOSABS 0.1 02/23/2014 1745   BASOSABS 0.0 02/23/2014 1745      Component Value Date/Time   NA 135* 02/24/2014 0505   K 4.2 02/24/2014 0505   CL 98 02/24/2014 0505   CO2 25 02/24/2014 0505   GLUCOSE 97 02/24/2014 0505   BUN 13 02/24/2014 0505     CREATININE 0.92 02/24/2014 0505   CALCIUM 8.0* 02/24/2014 0505   GFRNONAA 57* 02/24/2014 0505   GFRAA 66* 02/24/2014 0505    Recent Labs  02/27/14 0342  INR 1.14    Radiology/Studies: Dg Chest 2 View 02/26/2014   FINDINGS: Pacemaker has been removed. No pneumothorax. There is underlying emphysematous change. There is patchy lung scarring bilaterally, primarily in the left lower lobe. There is a small left effusion which is stable. There is no new opacity. Heart size is upper normal with normal pulmonary vascularity. There is atherosclerotic change in the aorta. No adenopathy. There is anterior wedging of a lower thoracic vertebral body, stable.   IMPRESSION: Pacemaker removed. No pneumothorax. Small left effusion, stable. Underlying emphysema with areas of scarring. No airspace consolidation.    Electronically Signed   By: Bretta Bang M.D.   On: 02/26/2014 07:31   X-ray Chest Pa And Lateral  02/23/2014     FINDINGS: Sequential pacemaker leads project within the right atrium right ventricle, stable from the prior study.  Lungs are hyperexpanded. There is upper lobe scarring. Chronic opacity is noted at the left lung base consistent with a combination of a small chronic effusion with atelectasis/scarring. No convincing infiltrate. No pulmonary edema.  Cardiac silhouette is mildly enlarged. Mediastinum is  normal in contour.   IMPRESSION: 1. No acute cardiopulmonary disease 2. Pacer leads are well positioned and stable from the prior study.    Electronically Signed   By: Amie Portlandavid  Ormond M.D.   On: 02/23/2014 21:53   Echocardiogram Study Conclusions - Left ventricle: The cavity size was normal. Systolic function was normal. The estimated ejection fraction was in the range of 55% to 60%. Features are consistent with a pseudonormal left ventricular filling pattern, with concomitant abnormal relaxation and increased filling pressure (grade 2 diastolic dysfunction). - Aortic valve: Diffuse  thickening and calcification, consistent with sclerosis. - Mitral valve: The MV leaflets appear thickened extending into the chordae tendinae which are also thickened and calcified There is heavy calcification that jets out into the LV cavity and seems to eminate from the interventricualr septum. This most likely represents a calcified papillary muscle since there appears to be calcified chordae tendinae off the MV leaftets in the vicinity of this. There are no parasternal views to adequately vsiualize the papillary muscle in short axis.If endocarditis if of concern recommend TEE to further evaluate - Right atrium: The atrium was mildly dilated. - Tricuspid valve: Mild-moderate regurgitation. - Pulmonary arteries: PA peak pressure: 50mm Hg (S). Impressions: - The right ventricular systolic pressure was increased consistent with mild pulmonary hypertension.  Telemetry: SR   Assessment and Plan  1. 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 Tachy-brady syndrome s/p PPM implant July 2014 3. PAF [name] Daniels is doing well post PPM system removal yesterday. Pocket is intact without significant bleeding or hematoma. She is afebrile. Will repeat CBC and BMET. She is receiving IV vancomycin as 1 of 2 blood cultures are positive for gram+ cocci in clusters. Will request ID consult for recommendations. Unable to pass probe for TEE yesterday. [name] Daniels reports occasional sensation that food is stuck but denies choking or difficulty swallowing. Will probably need GI evaluation.  Signed, EDMISTEN, BROOKE PA-C   I have seen, examined the patient, and reviewed the above assessment and plan.  Changes to above are made where necessary.  Appreciate ID recommendation for 14 days of antibiotics.  Would defer any further attempts at TEE unless persistently positive blood cultures. Her wound was redressed by me today.  Mild purulent drainage was expressed and 4 inches of iodoform were  removed.  Presently rhythm is stable.  I do not think that there is any urgent indication for pacing.  Hopefully, we can avoid PPM long term, keeping her in sinus on amiodarone. OK to restart coumadin at this time. Continue current treatment coarse.  Will need social work to assist with outpatient management.  Will also consult GI for dysphagia. Hope to discharge in next 1-2 days.  Co Sign: Hillis RangeJames Damarko Stitely, MD 02/27/2014 3:22 PM

## 2014-02-27 NOTE — Progress Notes (Signed)
ANTIBIOTIC CONSULT NOTE - follow up Pharmacy Consult for vancomycin Indication: pacemaker pocket infection  Allergies  Allergen Reactions  . Levaquin [Levofloxacin In D5w] Nausea And Vomiting    Patient Measurements: Height: 4\' 11"  (149.9 cm) Weight: 116 lb 13.1 oz (52.988 kg) IBW/kg (Calculated) : 43.2   Vital Signs: Temp: 98.4 F (36.9 C) (03/05 2104) Temp src: Oral (03/05 2104) BP: 148/49 mmHg (03/05 2104) Pulse Rate: 66 (03/05 2104) Intake/Output from previous day:   Intake/Output from this shift:    Labs: No results found for this basename: WBC, HGB, PLT, LABCREA, CREATININE,  in the last 72 hours Estimated Creatinine Clearance: 35.7 ml/min (by C-G formula based on Cr of 0.92).  Recent Labs  02/27/14 2120  VANCOTROUGH 6.9*     Microbiology: Recent Results (from the past 720 hour(s))  CULTURE, BLOOD (ROUTINE X 2)     Status: None   Collection Time    02/23/14  4:56 PM      Result Value Ref Range Status   Specimen Description BLOOD RIGHT HAND   Final   Special Requests BOTTLES DRAWN AEROBIC ONLY 5CC   Final   Culture  Setup Time     Final   Value: 02/24/2014 03:29     Performed at [facility]   Culture     Final   Value:        BLOOD CULTURE RECEIVED NO GROWTH TO DATE CULTURE WILL BE HELD FOR 5 DAYS BEFORE ISSUING A FINAL NEGATIVE REPORT     Performed at [facility]   Report Status PENDING   Incomplete  CULTURE, BLOOD (ROUTINE X 2)     Status: None   Collection Time    02/23/14  5:42 PM      Result Value Ref Range Status   Specimen Description BLOOD BLOOD RIGHT FOREARM   Final   Special Requests BOTTLES DRAWN AEROBIC AND ANAEROBIC 5CC EACH   Final   Culture  Setup Time     Final   Value: 02/24/2014 03:29     Performed at [facility]   Culture     Final   Value: STAPHYLOCOCCUS SPECIES (COAGULASE NEGATIVE)     Note: THE SIGNIFICANCE OF ISOLATING THIS ORGANISM FROM A SINGLE SET OF BLOOD CULTURES WHEN MULTIPLE SETS ARE DRAWN IS  UNCERTAIN. PLEASE NOTIFY THE MICROBIOLOGY DEPARTMENT WITHIN ONE WEEK IF SPECIATION AND SENSITIVITIES ARE REQUIRED.     Note: Gram Stain Report Called to,Read Back By and Verified With: ASHLEY EISMA ON 02/24/2014 AT 8:16P BY WILEJ     Performed at [facility]   Report Status 02/27/2014 FINAL   Final  WOUND CULTURE     Status: None   Collection Time    02/24/14  6:10 PM      Result Value Ref Range Status   Specimen Description WOUND   Final   Special Requests SUBCLAVIAN PACEMAKER POCKET   Final   Gram Stain     Final   Value: NO WBC SEEN     NO SQUAMOUS EPITHELIAL CELLS SEEN     NO ORGANISMS SEEN     Performed at [facility]   Culture     Final   Value: NO GROWTH 3 DAYS     Performed at [facility]   Report Status 02/27/2014 FINAL   Final    Medical History: Past Medical History  Diagnosis Date  . Atrial fibrillation     a. Dx 12/2011. b. Adm at  [facility] 2014 placed on sotalol but had bradycardia with this. c. Adm 06/2013 with AF-RVR - due to h/o bradycardia, underwent Medtronic ppm & amiodarone initiation (spont conv to NSR).   [name]. GERD (gastroesophageal reflux disease)   . Stroke, lacunar-identified by CT scanning 01/01/2012  . CHF (congestive heart failure)     [facility]. [facility].  [name]. Pneumonia 06/2013  . Renal insufficiency     a. noted by labs 06/2013.  . Bacteremia 02/23/2014    gram positive cocci in clusters  . Chronotropic incompetence with sinus node dysfunction 10/18/2013    Assessment: 83 yof presented to the ED with skin swelling and pus formation to pacemaker on L upper chest. Her pacemaker was removed 3/3. She is on day # 4/14 of vancomycin.  1 of 2 BC + for CoNS which may be real per ID consult.  Repeat BC have been ordered today by ID. Temp 98.4; WBC 7.9 on 3/2.   3/2 BC 1 of 2 + for CoNS 3/2 wound cs NGF 3/5 BC x 2 ordered  Vanc 3/1>>  Steady state vancomycin trough checked tonight. Trough = 6.9 mcg/ml @21 :20.    Lower than desired on dose of 500 mg IV q24h.   Goal of Therapy:  Vancomycin trough level 10-15 mcg/ml  Plan:  1. Increase Vancomycin to 750mg  IV Q24H 2. Check SS trough on new dose in ~ 4 days   Noah Delaineuth Catalino Plascencia, RPh Clinical Pharmacist Pager: [phone] 02/27/2014 10:47 PM

## 2014-02-27 NOTE — Progress Notes (Signed)
ANTIBIOTIC CONSULT NOTE - follow up Pharmacy Consult for vancomycin Indication: pacemaker pocket infection  Allergies  Allergen Reactions  . Levaquin [Levofloxacin In D5w] Nausea And Vomiting    Patient Measurements: Height: 4\' 11"  (149.9 cm) Weight: 116 lb 13.1 oz (52.988 kg) IBW/kg (Calculated) : 43.2 Adjusted Body Weight:   Vital Signs: Temp: 99.1 F (37.3 C) (03/05 0329) Temp src: Oral (03/05 0329) BP: 127/40 mmHg (03/05 0329) Pulse Rate: 67 (03/05 0329) Intake/Output from previous day:   Intake/Output from this shift:    Labs: No results found for this basename: WBC, HGB, PLT, LABCREA, CREATININE,  in the last 72 hours Estimated Creatinine Clearance: 35.7 ml/min (by C-G formula based on Cr of 0.92). No results found for this basename: VANCOTROUGH, VANCOPEAK, VANCORANDOM, GENTTROUGH, GENTPEAK, GENTRANDOM, TOBRATROUGH, TOBRAPEAK, TOBRARND, AMIKACINPEAK, AMIKACINTROU, AMIKACIN,  in the last 72 hours   Microbiology: Recent Results (from the past 720 hour(s))  CULTURE, BLOOD (ROUTINE X 2)     Status: None   Collection Time    02/23/14  4:56 PM      Result Value Ref Range Status   Specimen Description BLOOD RIGHT HAND   Final   Special Requests BOTTLES DRAWN AEROBIC ONLY 5CC   Final   Culture  Setup Time     Final   Value: 02/24/2014 03:29     [facility]formed [facility]   Culture     Final   Value:        BLOOD CULTURE RECEIVED NO GROWTH TO DATE CULTURE WILL BE HELD FOR 5 DAYS BEFORE ISSUING A FINAL NEGATIVE REPORT     [facility]formed at [facility]   Report Status PENDING   Incomplete  CULTURE, BLOOD (ROUTINE X 2)     Status: None   Collection Time    02/23/14  5:42 PM      Result Value Ref Range Status   Specimen Description BLOOD BLOOD RIGHT FOREARM   Final   Special Requests BOTTLES DRAWN AEROBIC AND ANAEROBIC 5CC EACH   Final   Culture  Setup Time     Final   Value: 02/24/2014 03:29     [facility]formed at [facility]   Culture     Final   Value: STAPHYLOCOCCUS SPECIES (COAGULASE NEGATIVE)     Note: THE SIGNIFICANCE OF ISOLATING THIS ORGANISM FROM A SINGLE SET OF BLOOD CULTURES WHEN MULTIPLE SETS ARE DRAWN IS UNCERTAIN. PLEASE NOTIFY THE MICROBIOLOGY DEPARTMENT WITHIN ONE WEEK IF SPECIATION AND SENSITIVITIES ARE REQUIRED.     Note: Gram Stain Report Called to,Read Back By and Verified With: ASHLEY EISMA ON 02/24/2014 AT 8:16P BY Serafina MitchellWILEJ     Performed at [facility]   Report Status 02/27/2014 FINAL   Final  WOUND CULTURE     Status: None   Collection Time    02/24/14  6:10 PM      Result Value Ref Range Status   Specimen Description WOUND   Final   Special Requests SUBCLAVIAN PACEMAKER POCKET   Final   Gram Stain     Final   Value: NO WBC SEEN     NO SQUAMOUS EPITHELIAL CELLS SEEN     NO ORGANISMS SEEN     [facility]formed at [facility]   Culture     Final   Value: NO GROWTH 3 DAYS     [facility]formed at [facility]   Report Status 02/27/2014 FINAL   Final    Medical History: Past Medical History  Diagnosis  Date  . Atrial fibrillation     a. Dx 12/2011. b. Adm at [facility] 2014 placed on sotalol but had bradycardia with this. c. Adm 06/2013 with AF-RVR - due to h/o bradycardia, underwent Medtronic ppm & amiodarone initiation (spont conv to NSR).   [name] GERD (gastroesophageal reflux disease)   . Stroke, lacunar-identified by CT scanning 01/01/2012  . CHF (congestive heart failure)     a. Admission for [facility]pEF at [facility] ~06/2013.  [name] Pneumonia 06/2013  . Renal insufficiency     a. noted by labs 06/2013.  . Bacteremia 02/23/2014    gram positive cocci in clusters  . Chronotropic incompetence with sinus node dysfunction 10/18/2013    Assessment: 83 yof presented to the ED with skin swelling and pus formation to pacemaker on L upper chest. Her pacemaker was removed 3/3. She is on day # 4/14 of vancomycin.  1 of 2 BC + for CoNS which may be real per ID consult.  Repeat BC have been ordered today by ID. Temp 99.1; WBC  7.9 on 3/2.   3/2 BC 1 of 2 + for CoNS 3/2 wound cs NGF 3/5 BC x 2 ordered  Vanc 3/1>>  Goal of Therapy:  Vancomycin trough level 10-15 mcg/ml  Plan:  1. Vancomycin 500mg  IV Q24H 2. Check SS trough tonight prior to 2100 dose Herby Abraham, Pharm.D. [phone] 02/27/2014 10:48 AM

## 2014-02-27 NOTE — Consult Note (Signed)
[facility]     Reason for Consult: PM Pocket infection    Referring Physician: Dr. [name]  Principal Problem:   Pacemaker infection Active Problems:   Atrial fibrillation   HTN (hypertension)   Chronic diastolic heart failure   Pacemaker-Medtronic   . amiodarone  200 mg Oral Daily  . vitamin B-12  500 mcg Oral Daily  . ferrous gluconate  325 mg Oral Q breakfast  . furosemide  20 mg Oral Daily  . sodium chloride  3 mL Intravenous Q12H  . vancomycin  500 mg Intravenous Q24H    Recommendations: Continue with vancomycin for two weeks from removal  Assessment: She has a PM pocket infection with removal.  Blood culture with CoNS.  This is associated with PM infections so may be real.   Will need 2 weeks of antibiotics.  I will repeat blood cultures.  With GI issues, could defer TEE unless blood cultures with Staph aureus or persistently positive.    Antibiotics: vancomycin  HPI: [name] Daniels is a 80 y.o. female with atrial fibrillation, tachybrady with Medtronic pacemaker placed July 2014 who developed redness, pain of PM pocket site.  No fever, no chills.  Unable to do TEE with ? GI issue.     Review of Systems: A comprehensive review of systems was negative.  Past Medical History  Diagnosis Date  . Atrial fibrillation     a. Dx 12/2011. b. Adm at [facility] 2014 placed on sotalol but had bradycardia with this. c. Adm 06/2013 with AF-RVR - due to h/o bradycardia, underwent Medtronic ppm & amiodarone initiation (spont conv to NSR).   [name] GERD (gastroesophageal reflux disease)   . Stroke, lacunar-identified by CT scanning 01/01/2012  . CHF (congestive heart failure)     a. Admission for HFpEF at [facility] ~06/2013.  [name] Pneumonia 06/2013  . Renal insufficiency     a. noted by labs 06/2013.  . Bacteremia 02/23/2014    gram positive cocci in clusters  . Chronotropic incompetence with sinus node dysfunction 10/18/2013    History  Substance Use Topics    . Smoking status: Never Smoker   . Smokeless tobacco: Never Used  . Alcohol Use: No    No family history on file. Allergies  Allergen Reactions  . Levaquin [Levofloxacin In D5w] Nausea And Vomiting    OBJECTIVE: Blood pressure 127/40, pulse 67, temperature 99.1 F (37.3 C), temperature source Oral, resp. rate 18, height 4\' 11"  (1.499 m), weight 116 lb 13.1 oz (52.988 kg), SpO2 93.00%. General: Awake, alert, nad Skin: no rashes, no janeway lesions Lungs: CTA B Cor: RRR without m Abdomen: soft, nt, nd Ext: no edema  Microbiology: Recent Results (from the past 240 hour(s))  CULTURE, BLOOD (ROUTINE X 2)     Status: None   Collection Time    02/23/14  4:56 PM      Result Value Ref Range Status   Specimen Description BLOOD RIGHT HAND   Final   Special Requests BOTTLES DRAWN AEROBIC ONLY 5CC   Final   Culture  Setup Time     Final   Value: 02/24/2014 03:29     Performed at [facility]   Culture     Final   Value:        BLOOD CULTURE RECEIVED NO GROWTH TO DATE CULTURE WILL BE HELD FOR 5 DAYS BEFORE ISSUING A FINAL NEGATIVE REPORT     Performed at [facility]   Report  Status PENDING   Incomplete  CULTURE, BLOOD (ROUTINE X 2)     Status: None   Collection Time    02/23/14  5:42 PM      Result Value Ref Range Status   Specimen Description BLOOD BLOOD RIGHT FOREARM   Final   Special Requests BOTTLES DRAWN AEROBIC AND ANAEROBIC [facility]5CC EACH   Final   Culture  Setup Time     Final   Value: 02/24/2014 03:29     [facility]formed at [facility]   Culture     Final   Value: STAPHYLOCOCCUS SPECIES     Note: Gram Stain Report Called to,Read Back By and Verified With: ASHLEY EISMA ON 02/24/2014 AT 8:16P BY WILEJ     Performed at [facility]   Report Status PENDING   Incomplete  WOUND CULTURE     Status: None   Collection Time    02/24/14  6:10 PM      Result Value Ref Range Status   Specimen Description WOUND   Final   Special Requests SUBCLAVIAN PACEMAKER  POCKET   Final   Gram Stain     Final   Value: NO WBC SEEN     NO SQUAMOUS EPITHELIAL CELLS SEEN     NO ORGANISMS SEEN     [facility]formed at [facility]   Culture     Final   Value: NO GROWTH 3 DAYS     [facility]formed at [facility]   Report Status 02/27/2014 FINAL   Final    Staci RighterOMER, ROBERT, MD [facility] [email or web address] [phone] pager  [phone] 02/27/2014, 8:12 AM

## 2014-02-28 ENCOUNTER — Inpatient Hospital Stay (HOSPITAL_COMMUNITY): Payer: Medicare HMO

## 2014-02-28 DIAGNOSIS — I5032 Chronic diastolic (congestive) heart failure: Secondary | ICD-10-CM

## 2014-02-28 DIAGNOSIS — I509 Heart failure, unspecified: Secondary | ICD-10-CM | POA: Diagnosis present

## 2014-02-28 DIAGNOSIS — N289 Disorder of kidney and ureter, unspecified: Secondary | ICD-10-CM | POA: Diagnosis present

## 2014-02-28 DIAGNOSIS — B958 Unspecified staphylococcus as the cause of diseases classified elsewhere: Secondary | ICD-10-CM

## 2014-02-28 DIAGNOSIS — I10 Essential (primary) hypertension: Secondary | ICD-10-CM

## 2014-02-28 LAB — PROTIME-INR
INR: 1.29 (ref 0.00–1.49)
PROTHROMBIN TIME: 15.8 s — AB (ref 11.6–15.2)

## 2014-02-28 MED ORDER — WARFARIN SODIUM 2 MG PO TABS
2.0000 mg | ORAL_TABLET | Freq: Once | ORAL | Status: AC
  Administered 2014-02-28: 2 mg via ORAL
  Filled 2014-02-28: qty 1

## 2014-02-28 MED ORDER — VANCOMYCIN HCL IN DEXTROSE 750-5 MG/150ML-% IV SOLN: 750.0000 mg | INTRAVENOUS | Status: DC

## 2014-02-28 NOTE — Progress Notes (Signed)
SUBJECTIVE: The patient is doing well today.  At this time, she denies chest pain, shortness of breath, or any new concerns.  S/p pacemaker system extraction 02-25-14.  1/2 blood cultures positive for coag negative staph- ID recommends 14 days of Vanc post device extraction.  Repeat blood cultures drawn 02-27-14  Unable to pass TEE probe 02-26-14 - for esophagram and barium swallow/GI eval today.   CURRENT MEDICATIONS: . amiodarone  200 mg Oral Daily  . vitamin B-12  500 mcg Oral Daily  . ferrous gluconate  325 mg Oral Q breakfast  . furosemide  20 mg Oral Daily  . sodium chloride  3 mL Intravenous Q12H  . vancomycin  750 mg Intravenous Q24H  . Warfarin - Pharmacist Dosing Inpatient   Does not apply q1800      OBJECTIVE: Physical Exam: Filed Vitals:   02/27/14 0329 02/27/14 1400 02/27/14 2104 02/28/14 0500  BP: 127/40 127/45 148/49 151/52  Pulse: 67 62 66 59  Temp: 99.1 F (37.3 C) 97.6 F (36.4 C) 98.4 F (36.9 C) 99.1 F (37.3 C)  TempSrc: Oral Oral Oral Oral  Resp: 18 20 18 18   Height:      Weight: 116 lb 13.1 oz (52.988 kg)   115 lb 10.4 oz (52.457 kg)  SpO2: 93% 95% 94% 95%    Telemetry reveals sinus rhythm  GEN- The patient is well appearing, alert and oriented x 3 today.   Head- normocephalic, atraumatic Eyes-  Sclera clear, conjunctiva pink Ears- hearing intact Oropharynx- clear Neck- supple, no JVP Lymph- no cervical lymphadenopathy Wound mild drainage Lungs- Clear to ausculation bilaterally, normal work of breathing Heart- Regular rate and rhythm, no murmurs, rubs or gallops, PMI not laterally displaced GI- soft, NT, ND, + BS Extremities- no clubbing, cyanosis, or edema Skin- no rash or lesion Psych- euthymic mood, full affect Neuro- strength and sensation are intact  LABS: INR 1.29  RADIOLOGY: Dg Chest 2 View 02/26/2014   CLINICAL DATA:  Pacemaker removal  EXAM: CHEST  2 VIEW  COMPARISON:  February 23, 2014  FINDINGS: Pacemaker has been removed. No  pneumothorax. There is underlying emphysematous change. There is patchy lung scarring bilaterally, primarily in the left lower lobe. There is a small left effusion which is stable. There is no new opacity. Heart size is upper normal with normal pulmonary vascularity. There is atherosclerotic change in the aorta. No adenopathy. There is anterior wedging of a lower thoracic vertebral body, stable.  IMPRESSION: Pacemaker removed. No pneumothorax. Small left effusion, stable. Underlying emphysema with areas of scarring. No airspace consolidation.   Electronically Signed   By: Bretta Bang M.D.   On: 02/26/2014 07:31   X-ray Chest Pa And Lateral  02/23/2014   CLINICAL DATA:  Pacemaker infection.  Evaluate pacemaker leads.  EXAM: CHEST  2 VIEW  COMPARISON:  09/11/2013  FINDINGS: Sequential pacemaker leads project within the right atrium right ventricle, stable from the prior study.  Lungs are hyperexpanded. There is upper lobe scarring. Chronic opacity is noted at the left lung base consistent with a combination of a small chronic effusion with atelectasis/scarring. No convincing infiltrate. No pulmonary edema.  Cardiac silhouette is mildly enlarged. Mediastinum is normal in contour.  IMPRESSION: 1. No acute cardiopulmonary disease 2. Pacer leads are well positioned and stable from the prior study.   Electronically Signed   By: Amie Portland M.D.   On: 02/23/2014 21:53    ASSESSMENT AND PLAN:  Principal Problem:   Pacemaker infection Active  Problems:   Atrial fibrillation   HTN (hypertension)   Chronic diastolic heart failure   Pacemaker-Medtronic  ABX per ID  Home today followup next week fro suture removal  Will see her in three weeks  Continue amiodarone

## 2014-02-28 NOTE — Progress Notes (Signed)
[facility]  Date of Admission:  02/23/2014  Antibiotics:  Subjective: No acute events  Objective: Temp:  [97.6 F (36.4 C)-99.1 F (37.3 C)] 99.1 F (37.3 C) (03/06 0500) Pulse Rate:  [59-66] 59 (03/06 0500) Resp:  [18-20] 18 (03/06 0500) BP: (127-151)/(45-52) 151/52 mmHg (03/06 0500) SpO2:  [94 %-95 %] 95 % (03/06 0500) Weight:  [115 lb 10.4 oz (52.457 kg)] 115 lb 10.4 oz (52.457 kg) (03/06 0500)    Lab Results Lab Results  Component Value Date   WBC 7.9 02/24/2014   HGB 11.6* 02/24/2014   HCT 34.4* 02/24/2014   MCV 92.5 02/24/2014   PLT 261 02/24/2014    Lab Results  Component Value Date   CREATININE 0.92 02/24/2014   BUN 13 02/24/2014   NA 135* 02/24/2014   K 4.2 02/24/2014   CL 98 02/24/2014   CO2 25 02/24/2014    Lab Results  Component Value Date   ALT 25 10/18/2013   AST 32 10/18/2013   ALKPHOS 62 10/18/2013   BILITOT 0.4 10/18/2013      Microbiology: Recent Results (from the past 240 hour(s))  CULTURE, BLOOD (ROUTINE X 2)     Status: None   Collection Time    02/23/14  4:56 PM      Result Value Ref Range Status   Specimen Description BLOOD RIGHT HAND   Final   Special Requests BOTTLES DRAWN AEROBIC ONLY 5CC   Final   Culture  Setup Time     Final   Value: 02/24/2014 03:29     Performed at [facility]   Culture     Final   Value:        BLOOD CULTURE RECEIVED NO GROWTH TO DATE CULTURE WILL BE HELD FOR 5 DAYS BEFORE ISSUING A FINAL NEGATIVE REPORT     Performed at [facility]   Report Status PENDING   Incomplete  CULTURE, BLOOD (ROUTINE X 2)     Status: None   Collection Time    02/23/14  5:42 PM      Result Value Ref Range Status   Specimen Description BLOOD BLOOD RIGHT FOREARM   Final   Special Requests BOTTLES DRAWN AEROBIC AND ANAEROBIC 5CC EACH   Final   Culture  Setup Time     Final   Value: 02/24/2014 03:29     Performed at [facility]   Culture     Final   Value: STAPHYLOCOCCUS SPECIES (COAGULASE  NEGATIVE)     Note: THE SIGNIFICANCE OF ISOLATING THIS ORGANISM FROM A SINGLE SET OF BLOOD CULTURES WHEN MULTIPLE SETS ARE DRAWN IS UNCERTAIN. PLEASE NOTIFY THE MICROBIOLOGY DEPARTMENT WITHIN ONE WEEK IF SPECIATION AND SENSITIVITIES ARE REQUIRED.     Note: Gram Stain Report Called to,Read Back By and Verified With: ASHLEY EISMA ON 02/24/2014 AT 8:16P BY Serafina Mitchell     Performed at [facility]   Report Status 02/27/2014 FINAL   Final  WOUND CULTURE     Status: None   Collection Time    02/24/14  6:10 PM      Result Value Ref Range Status   Specimen Description WOUND   Final   Special Requests SUBCLAVIAN PACEMAKER POCKET   Final   Gram Stain     Final   Value: NO WBC SEEN     NO SQUAMOUS EPITHELIAL CELLS SEEN     NO ORGANISMS SEEN     Performed at [facility]  Culture     Final   Value: NO GROWTH 3 DAYS     [facility]   Report Status 02/27/2014 FINAL   Final  CULTURE, BLOOD (ROUTINE X 2)     Status: None   Collection Time    02/27/14  9:15 AM      Result Value Ref Range Status   Specimen Description BLOOD RIGHT HAND   Final   Special Requests BOTTLES DRAWN AEROBIC ONLY 5CC   Final   Culture  Setup Time     Final   Value: 02/27/2014 14:50     Performed at [facility]   Culture     Final   Value:        BLOOD CULTURE RECEIVED NO GROWTH TO DATE CULTURE WILL BE HELD FOR 5 DAYS BEFORE ISSUING A FINAL NEGATIVE REPORT     [facility]formed at [facility]   Report Status PENDING   Incomplete  CULTURE, BLOOD (ROUTINE X 2)     Status: None   Collection Time    02/27/14  9:30 AM      Result Value Ref Range Status   Specimen Description BLOOD LEFT HAND   Final   Special Requests BOTTLES DRAWN AEROBIC ONLY 3CC   Final   Culture  Setup Time     Final   Value: 02/27/2014 14:50     [facility]formed at [facility]   Culture     Final   Value:        BLOOD CULTURE RECEIVED NO GROWTH TO DATE CULTURE WILL BE HELD FOR 5 DAYS BEFORE ISSUING A FINAL  NEGATIVE REPORT     Performed at [facility]   Report Status PENDING   Incomplete    Studies/Results: Dg Esophagus  02/28/2014   CLINICAL DATA:  Unable to pass a TEE probe. Question stricture versus spasm.  EXAM: ESOPHOGRAM/BARIUM SWALLOW  TECHNIQUE: Single contrast examination was performed using thin barium or water soluble.  COMPARISON:  None.  FLUOROSCOPY TIME:  29 seconds  FINDINGS: Examination was performed in the semi-upright position. This is a limited evaluation of the esophagus. Contrast flowed freely through the esophagus without evidence of stricture or mass. There was grossly normal esophageal mucosa without evidence of irregularity or ulceration. There is mild intermittent spasm of the distal esophagus just proximal to the gastroesophageal junction. . No evidence of reflux. No definite hiatal hernia was demonstrated.  At the end of the examination a 12.465mm barium tablet was administered which transited through the esophagus and esophagogastric junction without delay.  IMPRESSION: 1. Limited examination esophagus in the semi-upright position demonstrates no esophageal stricture. There is normal transit of a barium tablet.   Electronically Signed   By: Elige KoHetal  Patel   On: 02/28/2014 08:34    Assessment/Plan: 1) PM pocket infection - on vancomycin.  Continue through March 14th (14 days total). -antibiotics per home health protocol. -ok to give verbal order for antibiotics.  -trough goal 12-15.   -lab results to [facility] -home health can pull picc line at the end of treatment  COMER, Molly MaduroOBERT, MD [email or web address] [phone] pager   [phone] 02/28/2014, 8:41 AM

## 2014-02-28 NOTE — Progress Notes (Signed)
       Patient Name: [name] DresserBetty Ramey      SUBJECTIVE: no change in symptoms  Past Medical History  Diagnosis Date  . Atrial fibrillation     a. Dx 12/2011. b. Adm at [facility] 2014 placed on sotalol but had bradycardia with this. c. Adm 06/2013 with AF-RVR - due to h/o bradycardia, underwent Medtronic ppm & amiodarone initiation (spont conv to NSR).   [name]. GERD (gastroesophageal reflux disease)   . Stroke, lacunar-identified by CT scanning 01/01/2012  . CHF (congestive heart failure)     a. [facility].  [name]. Pneumonia 06/2013  . Renal insufficiency     a. noted by labs 06/2013.  . Bacteremia 02/23/2014    gram positive cocci in clusters  . Chronotropic incompetence with sinus node dysfunction 10/18/2013    Scheduled Meds:  Scheduled Meds: . amiodarone  200 mg Oral Daily  . vitamin B-12  500 mcg Oral Daily  . ferrous gluconate  325 mg Oral Q breakfast  . furosemide  20 mg Oral Daily  . sodium chloride  3 mL Intravenous Q12H  . vancomycin  750 mg Intravenous Q24H  . Warfarin - Pharmacist Dosing Inpatient   Does not apply q1800   Continuous Infusions:   PHYSICAL EXAM Filed Vitals:   02/27/14 0329 02/27/14 1400 02/27/14 2104 02/28/14 0500  BP: 127/40 127/45 148/49 151/52  Pulse: 67 62 66 59  Temp: 99.1 F (37.3 C) 97.6 F (36.4 C) 98.4 F (36.9 C) 99.1 F (37.3 C)  TempSrc: Oral Oral Oral Oral  Resp: 18 20 18 18   Height:      Weight: 116 lb 13.1 oz (52.988 kg)   115 lb 10.4 oz (52.457 kg)  SpO2: 93% 95% 94% 95%      TELEMETRY: Reviewed telemetry pt in nsr:   No intake or output data in the 24 hours ending 02/28/14 0759  LABS: Basic Metabolic Panel:  Recent Labs Lab 02/23/14 1745 02/24/14 0505  NA 140 135*  K 4.1 4.2  CL 98 98  CO2 28 25  GLUCOSE 99 97  BUN 15 13  CREATININE 1.06 0.92  CALCIUM 8.5 8.0*   Cardiac Enzymes: No results found for this basename: CKTOTAL, CKMB, CKMBINDEX, TROPONINI,  in the last 72  hours CBC:  Recent Labs Lab 02/23/14 1745 02/24/14 0505  WBC 8.6 7.9  NEUTROABS 5.8  --   HGB 12.2 11.6*  HCT 35.6* 34.4*  MCV 92.2 92.5  PLT 267 261   PROTIME:  Recent Labs  02/26/14 0500 02/27/14 0342 02/28/14 0500  LABPROT 15.4* 14.4 15.8*  INR 1.25 1.14 1.29   Liver Function Tests: No results found for this basename: AST, ALT, ALKPHOS, BILITOT, PROT, ALBUMIN,  in the last 72 hours No results found for this basename: LIPASE, AMYLASE,  in the last 72 hours BNP: BNP (last 3 results)  Recent Labs  07/09/13 1347  PROBNP 649.4*    ASSESSMENT AND PLAN:  Principal Problem:   Pacemaker infection Active Problems:   Atrial fibrillation   HTN (hypertension)   Chronic diastolic heart failure   Pacemaker-Medtronic  Low grade fever Will ID arrange for outpt ABX  And PICC  Will ask for their assistance  Signed, Sherryl MangesSteven Zalia Hautala MD  02/28/2014

## 2014-02-28 NOTE — Discharge Summary (Signed)
Discharge Summary   Patient ID: [name] Daniels MRN: [id number], DOB/AGE: [birth year] 80 y.o. Admit date: 02/23/2014 D/C date:     02/28/2014  Primary Cardiologist: Dr. [name]   Principal Problem:   Pacemaker infection Active Problems:   Bacteremia   Atrial fibrillation   Stroke, lacunar-identified by CT scanning   HTN (hypertension)   Chronic diastolic heart failure   Pacemaker-Medtronic   CHF (congestive heart failure)   Renal insufficiency   Admission Dates: 02/23/14 - 02/28/14 Discharge Diagnosis: Pacemaker infection and bacteriemia   Hospital Course: [name] is an 80 year old female with a history of CKD, diastolic HF, HTN, prior CVA, atrial fibrillation on amiodarone/coumadin and tachy-brady syndrome s/p PPM who presented to [facility] on 02/23/14 with evidence of pacemaker infection. She underwent pacemaker implantation by Dr. [name] in 06/2013. She was noted previously to have tachycardia with AFib as well as bradycardia at times requiring pacing backup. Since being placed on amiodarone, her device has detected no further atrial fibrillation and she has done quite well. Patient is not device dependent with sinus rate around 50 with intrinsic conduction.  Hospital course: She presented to [facility] on 02/23/14 with 1-2 days of erythema and swelling over her pacemaker site concerning for infection. She was initiated on vancomycin in the ER after blood cultures were drawn. Her INR was noted to be 2.7 and her coumadin was held. She underwent device exploration on 02/24/14 which demonstrated significant purulence within the pocket. A wound culture was sent and the pocket was debrided. The pocket was closed with plans for pacemaker removal the following day. Device extraction was performed on 02/25/14. The pulse generator and the atrial and ventricular leads were removed. Blood cultures returned positive on 02/25/14 with gram + cocci in clusters. A TEE was attempted on 02/26/14 to rule out infective endocarditis;  however, due to GI issues the probe was unable to be passed down her esophagus. 2D ECHO on 02/25/14 revealed EF 55- 60% and grade 2 diastolic dysfunction (see full report below); however, it was not suggestive of endocarditis. Infectious disease was consulted who agreed that her presentation was consistent with PM pocket infection. They reported that it was okay to defer any further attemtps at TEE unless persistently positive blood cultures. They recommended the continuation of vancomycin for two weeks from device removal. Presently, her rhythm is stable and Dr. Johney Frame felt that there was no urgent indication for pacing. Hopefully, long term PPM can be avoided by keeping her in sinus on amiodarone. Subsequent wound cultures have been negative and her coumadin has been resumed. Additionally, a barium swallow/GI eval on 02/27/14 was normal. No further work up was pursued.   [name] is doing well today. The pocket is intact without significant bleeding or hematoma. The patient has been seen by Dr. [name] today and deemed stable for discharge home. All follow-up appointments have been scheduled. No changes have been made to her medicines. A PICC line was placed on the day of discharge for Vancomyocin administration. She will be discharged on Vancomycin 750mg  IV Q24H which will be managed by home health services. She will resume home dose of coumadin 2 mg qday and allow INR to slowly increase (INR 1.29 today). She has a coumadin clinic appointment scheduled for Monday.   Discharge Vitals: Blood pressure 151/52, pulse 59, temperature 99.1 F (37.3 C), temperature source Oral, resp. rate 18, height 4\' 11"  (1.499 m), weight 115 lb 10.4 oz (52.457 kg), SpO2 95.00%.  Labs: Lab Results  Component  Value Date   WBC 7.9 02/24/2014   HGB 11.6* 02/24/2014   HCT 34.4* 02/24/2014   MCV 92.5 02/24/2014   PLT 261 02/24/2014     Recent Labs Lab 02/24/14 0505  NA 135*  K 4.2  CL 98  CO2 25  BUN 13  CREATININE 0.92    CALCIUM 8.0*  GLUCOSE 97    Diagnostic Studies/Procedures   Echocardiogram 02/25/14 Study Conclusions - Left ventricle: The cavity size was normal. Systolic function was normal. The estimated ejection fraction was in the range of 55% to 60%. Features are consistent with a pseudonormal left ventricular filling pattern, with concomitant abnormal relaxation and increased filling pressure (grade 2 diastolic dysfunction). - Aortic valve: Diffuse thickening and calcification, consistent with sclerosis. - Mitral valve: The MV leaflets appear thickened extending into the chordae tendinae which are also thickened and calcified There is heavy calcification that jets out into the LV cavity and seems to eminate from the interventricualr septum. This most likely represents a calcified papillary muscle since there appears to be calcified chordae tendinae off the MV leaftets in the vicinity of this. There are no parasternal views to adequately vsiualize the papillary muscle in short axis.If endocarditis if of concern recommend TEE to further evaluate - Right atrium: The atrium was mildly dilated. - Tricuspid valve: Mild-moderate regurgitation. - Pulmonary arteries: PA peak pressure: 50mm Hg (S). Impressions: - The right ventricular systolic pressure was increased consistent with mild pulmonary hypertension.   Dg Chest 2 View  02/26/2014   CLINICAL DATA:  Pacemaker removal  EXAM: CHEST  2 VIEW  COMPARISON:  February 23, 2014  FINDINGS: Pacemaker has been removed. No pneumothorax. There is underlying emphysematous change. There is patchy lung scarring bilaterally, primarily in the left lower lobe. There is a small left effusion which is stable. There is no new opacity. Heart size is upper normal with normal pulmonary vascularity. There is atherosclerotic change in the aorta. No adenopathy. There is anterior wedging of a lower thoracic vertebral body, stable.  IMPRESSION: Pacemaker removed. No  pneumothorax. Small left effusion, stable. Underlying emphysema with areas of scarring. No airspace consolidation.     X-ray Chest Pa And Lateral   02/23/2014   CLINICAL DATA:  Pacemaker infection.  Evaluate pacemaker leads.  EXAM: CHEST  2 VIEW  COMPARISON:  09/11/2013  FINDINGS: Sequential pacemaker leads project within the right atrium right ventricle, stable from the prior study.  Lungs are hyperexpanded. There is upper lobe scarring. Chronic opacity is noted at the left lung base consistent with a combination of a small chronic effusion with atelectasis/scarring. No convincing infiltrate. No pulmonary edema.  Cardiac silhouette is mildly enlarged. Mediastinum is normal in contour.  IMPRESSION: 1. No acute cardiopulmonary disease 2. Pacer leads are well positioned and stable from the prior study.     Dg Esophagus  02/28/2014   CLINICAL DATA:  Unable to pass a TEE probe. Question stricture versus spasm.  EXAM: ESOPHOGRAM/BARIUM SWALLOW  TECHNIQUE: Single contrast examination was performed using thin barium or water soluble.  COMPARISON:  None.  FLUOROSCOPY TIME:  29 seconds  FINDINGS: Examination was performed in the semi-upright position. This is a limited evaluation of the esophagus. Contrast flowed freely through the esophagus without evidence of stricture or mass. There was grossly normal esophageal mucosa without evidence of irregularity or ulceration. There is mild intermittent spasm of the distal esophagus just proximal to the gastroesophageal junction. . No evidence of reflux. No definite hiatal hernia was demonstrated.  At  the end of the examination a 12.49mm barium tablet was administered which transited through the esophagus and esophagogastric junction without delay.  IMPRESSION: 1. Limited examination esophagus in the semi-upright position demonstrates no esophageal stricture. There is normal transit of a barium tablet.      Discharge Medications     Medication List         amiodarone 200  MG tablet  Commonly known as:  PACERONE  Take 1 tablet (200 mg total) by mouth daily.     ferrous gluconate 325 MG tablet  Commonly known as:  FERGON  Take 325 mg by mouth daily with breakfast.     furosemide 40 MG tablet  Commonly known as:  LASIX  20 mg daily.     Iron 325 (65 FE) MG Tabs  Take 65 mg by mouth daily.     LIPITOR 20 MG tablet  Generic drug:  atorvastatin  Take 10 mg by mouth at bedtime.     lisinopril 10 MG tablet  Commonly known as:  PRINIVIL,ZESTRIL  10 mg daily.     Vancomycin 750 MG/150ML Soln  Commonly known as:  VANCOCIN  Inject 150 mLs (750 mg total) into the vein daily.     vitamin B-12 500 MCG tablet  Commonly known as:  CYANOCOBALAMIN  Take 500 mcg by mouth daily.     warfarin 2 MG tablet  Commonly known as:  COUMADIN  Take 2 mg by mouth daily.        Disposition   The patient will be discharged in stable condition to home.  Future Appointments Provider Department Dept Phone   03/03/2014 2:30 PM [facility] [place] Office [phone]   03/03/2014 3:00 PM [facility] Office [phone]     Follow-up Information   Follow up with [facility]. (Registered Nurse for IV Antibiotic Therapy)    Contact information:   [address] [place] [address] [phone]       Follow up with [facility] On 03/03/2014. (@  2:30 pm to check your INR.)    Contact information:   [address] [place] [address]         Duration of Discharge Encounter: Greater than 30 minutes including physician and PA time.  SignedVenetia Maxon, Annalissa Murphey PA-C 02/28/2014, 1:19 PM

## 2014-02-28 NOTE — Progress Notes (Signed)
ANTICOAGULATION CONSULT NOTE   Pharmacy Consult for couamdin Indication: atrial fibrillation  Allergies  Allergen Reactions  . Levaquin [Levofloxacin In D5w] Nausea And Vomiting    Patient Measurements: Height: 4\' 11"  (149.9 cm) Weight: 115 lb 10.4 oz (52.457 kg) IBW/kg (Calculated) : 43.2   Vital Signs: Temp: 99.1 F (37.3 C) (03/06 0500) Temp src: Oral (03/06 0500) BP: 151/52 mmHg (03/06 0500) Pulse Rate: 59 (03/06 0500)  Labs:  Recent Labs  02/26/14 0500 02/27/14 0342 02/28/14 0500  LABPROT 15.4* 14.4 15.8*  INR 1.25 1.14 1.29    Estimated Creatinine Clearance: 35.5 ml/min (by C-G formula based on Cr of 0.92).   Medical History: Past Medical History  Diagnosis Date  . Atrial fibrillation     a. Dx 12/2011. b. Adm at [facility] 2014 placed on sotalol but had bradycardia with this. c. Adm 06/2013 with AF-RVR - due to h/o bradycardia, underwent Medtronic ppm & amiodarone initiation (spont conv to NSR).   [name]. GERD (gastroesophageal reflux disease)   . Stroke, lacunar-identified by CT scanning 01/01/2012  . CHF (congestive heart failure)     a. [facility].  [name]. Pneumonia 06/2013  . Renal insufficiency     a. noted by labs 06/2013.  . Bacteremia 02/23/2014    gram positive cocci in clusters  . Chronotropic incompetence with sinus node dysfunction 10/18/2013    Medications:  Prescriptions prior to admission  Medication Sig Dispense Refill  . amiodarone (PACERONE) 200 MG tablet Take 1 tablet (200 mg total) by mouth daily.  90 tablet  2  . atorvastatin (LIPITOR) 20 MG tablet Take 10 mg by mouth at bedtime.   30 tablet  11  . ferrous gluconate (FERGON) 325 MG tablet Take 325 mg by mouth daily with breakfast.      . Ferrous Sulfate (IRON) 325 (65 FE) MG TABS Take 65 mg by mouth daily.      . furosemide (LASIX) 40 MG tablet 20 mg daily.      [name]. lisinopril (PRINIVIL,ZESTRIL) 10 MG tablet 10 mg daily.      . vitamin B-12 (CYANOCOBALAMIN) 500 MCG  tablet Take 500 mcg by mouth daily.      [name]. warfarin (COUMADIN) 2 MG tablet Take 2 mg by mouth daily.        80 yo F admitted 02/23/2014   skin swelling and pus formation to pacemaker on L upper chest since yesterday.  Pharmacy consulted to dose vancomycin.  PMH: afib, GERD, CVA, CHF, CKD  ZO:XWRUAC:Afib Vitamin K 5mg  po 3/2 at 1843  2mg  daily PTA, therapeutic on admission  EA:VWUJ:skin swelling and pus formation to pacemaker on L upper chest. Her pacemaker was removed 3/3.  Afeb, wbc wnl  3/2 BC 1 of 2 + for CoNS 3/2 wound cs NGF 3/5 BC x 2 ordered  Vanc 3/1>> 3.5 trough, dose increased 750 q24h (for 14d tx)  CV: Afib, stroke hx (2013), CHF Amiodarone, Lipitor  Endo: no DM  Renal: Renal insufficiency, Scr stable, lytes ok  Best practices: Home meds resumed  Goal of Therapy: INR 2-3 Vancomycin trough 12-15 mcg/ml (per [facility])  Plan: Continue Vancomycin 750 mg IV q24h Warfarin 2mg  PO today Daily INR, follow up vancomycin trough at steady state.  Thank you for allowing pharmacy to be a part of this patients care team.  Lovenia KimJulie Emmanuella Mirante Pharm.D., BCPS Clinical Pharmacist 02/28/2014 11:53 AM Pager: [phone] Phone: [phone]

## 2014-02-28 NOTE — Discharge Instructions (Signed)
Information on my medicine - Coumadin®   (Warfarin) ° °This medication education was reviewed with me or my healthcare representative as part of my discharge preparation.  The pharmacist that spoke with me during my hospital stay was:  Mylasia Vorhees Christine Bates Demarco Bacci, RPH ° °Why was Coumadin prescribed for you? °Coumadin was prescribed for you because you have a blood clot or a medical condition that can cause an increased risk of forming blood clots. Blood clots can cause serious health problems by blocking the flow of blood to the heart, lung, or brain. Coumadin can prevent harmful blood clots from forming. °As a reminder your indication for Coumadin is:   Select from menu ° °What test will check on my response to Coumadin? °While on Coumadin (warfarin) you will need to have an INR test regularly to ensure that your dose is keeping you in the desired range. The INR (international normalized ratio) number is calculated from the result of the laboratory test called prothrombin time (PT). ° °If an INR APPOINTMENT HAS NOT ALREADY BEEN MADE FOR YOU please schedule an appointment to have this lab work done by your health care provider within 7 days. °Your INR goal is usually a number between:  2 to 3 or your provider may give you a more narrow range like 2-2.5.  Ask your health care provider during an office visit what your goal INR is. ° °What  do you need to  know  About  COUMADIN? °Take Coumadin (warfarin) exactly as prescribed by your healthcare provider about the same time each day.  DO NOT stop taking without talking to the doctor who prescribed the medication.  Stopping without other blood clot prevention medication to take the place of Coumadin may increase your risk of developing a new clot or stroke.  Get refills before you run out. ° °What do you do if you miss a dose? °If you miss a dose, take it as soon as you remember on the same day then continue your regularly scheduled regimen the next day.  Do not take  two doses of Coumadin at the same time. ° °Important Safety Information °A possible side effect of Coumadin (Warfarin) is an increased risk of bleeding. You should call your healthcare provider right away if you experience any of the following: °  Bleeding from an injury or your nose that does not stop. °  Unusual colored urine (red or dark brown) or unusual colored stools (red or black). °  Unusual bruising for unknown reasons. °  A serious fall or if you hit your head (even if there is no bleeding). ° °Some foods or medicines interact with Coumadin® (warfarin) and might alter your response to warfarin. To help avoid this: °  Eat a balanced diet, maintaining a consistent amount of Vitamin K. °  Notify your provider about major diet changes you plan to make. °  Avoid alcohol or limit your intake to 1 drink for women and 2 drinks for men per day. °(1 drink is 5 oz. wine, 12 oz. beer, or 1.5 oz. liquor.) ° °Make sure that ANY health care provider who prescribes medication for you knows that you are taking Coumadin (warfarin).  Also make sure the healthcare provider who is monitoring your Coumadin knows when you have started a new medication including herbals and non-prescription products. ° °Coumadin® (Warfarin)  Major Drug Interactions  °Increased Warfarin Effect Decreased Warfarin Effect  °Alcohol (large quantities) °Antibiotics (esp. Septra/Bactrim, Flagyl, Cipro) °Amiodarone (Cordarone) °Aspirin (ASA) °Cimetidine (  Tagamet) °Megestrol (Megace) °NSAIDs (ibuprofen, naproxen, etc.) °Piroxicam (Feldene) °Propafenone (Rythmol SR) °Propranolol (Inderal) °Isoniazid (INH) °Posaconazole (Noxafil) Barbiturates (Phenobarbital) °Carbamazepine (Tegretol) °Chlordiazepoxide (Librium) °Cholestyramine (Questran) °Griseofulvin °Oral Contraceptives °Rifampin °Sucralfate (Carafate) °Vitamin K  ° °Coumadin® (Warfarin) Major Herbal Interactions  °Increased Warfarin Effect Decreased Warfarin Effect  °Garlic °Ginseng °Ginkgo biloba  Coenzyme Q10 °Green tea °St. John’s wort   ° °Coumadin® (Warfarin) FOOD Interactions  °Eat a consistent number of servings per week of foods HIGH in Vitamin K °(1 serving = ½ cup)  °Collards (cooked, or boiled & drained) °Kale (cooked, or boiled & drained) °Mustard greens (cooked, or boiled & drained) °Parsley *serving size only = ¼ cup °Spinach (cooked, or boiled & drained) °Swiss chard (cooked, or boiled & drained) °Turnip greens (cooked, or boiled & drained)  °Eat a consistent number of servings per week of foods MEDIUM-HIGH in Vitamin K °(1 serving = 1 cup)  °Asparagus (cooked, or boiled & drained) °Broccoli (cooked, boiled & drained, or raw & chopped) °Brussel sprouts (cooked, or boiled & drained) *serving size only = ½ cup °Lettuce, raw (green leaf, endive, romaine) °Spinach, raw °Turnip greens, raw & chopped  ° °These websites have more information on Coumadin (warfarin):  www.coumadin.com; °www.ahrq.gov/consumer/coumadin.htm; ° ° ° °

## 2014-03-01 ENCOUNTER — Inpatient Hospital Stay (HOSPITAL_COMMUNITY): Payer: Medicare HMO

## 2014-03-01 LAB — PROTIME-INR
INR: 1.29 (ref 0.00–1.49)
PROTHROMBIN TIME: 15.8 s — AB (ref 11.6–15.2)

## 2014-03-01 MED ORDER — SODIUM CHLORIDE 0.9 % IJ SOLN: 10.0000 mL | INTRAMUSCULAR | Status: DC | PRN

## 2014-03-01 MED ORDER — WARFARIN SODIUM 2 MG PO TABS
2.0000 mg | ORAL_TABLET | Freq: Every day | ORAL | Status: DC
  Filled 2014-03-01: qty 1

## 2014-03-01 MED ORDER — SODIUM CHLORIDE 0.9 % IJ SOLN: 10.0000 mL | Freq: Two times a day (BID) | INTRAMUSCULAR | Status: DC

## 2014-03-01 NOTE — Progress Notes (Signed)
   CARE MANAGEMENT NOTE 03/01/2014  Patient:  [name] Daniels,[name] Daniels   Account Number:  [id number]  Date Initiated:  02/25/2014  Documentation initiated by:  Daniels,[name]  Subjective/Objective Assessment:   Pt admitted for pacemaker infection. Initiated on IV vancomycin. 02-25-14 pt underwent pacemaker system removal.     Action/Plan:   CM will continue to monitor for disposition needs.   Anticipated DC Date:  02/27/2014   Anticipated DC Plan:  HOME W HOME HEALTH SERVICES      DC Planning Services  CM consult      [facility]   Choice offered to / List presented to:  C-1 Patient        HH arranged  HH-1 RN  HH-10 DISEASE MANAGEMENT      HH agency  [facility]   Status of service:  Completed, signed off Medicare Important Message given?   (If response is "NO", the following Medicare IM given date fields will be blank) Date Medicare IM given:   Date Additional Medicare IM given:    Discharge Disposition:  HOME W HOME HEALTH SERVICES  Per UR Regulation:  Reviewed for med. necessity/level of care/duration of stay  If discussed at Long Length of Stay Meetings, dates discussed:    Comments:  03/01/14 12:15 CM notified [facility] rep of pt discharge. HHRN arranged by previous CM.  [facility] to ensure all was arranged and Pharmacist stated pt already has everything she needs since yesterday.  No other CM needs were communicated.  [name] JakschSarah Chandra Daniels, BSN, CM [phone].   02-28-14 [address] [name] Daniels, [place] [phone] [facility]. [facility] [employer] [facility]. However, face to face needs to be filled out. [facility]. SOC to begin within 24 hrs of d/c. No further needs from CM at this time.   02-27-14 [address] [name] Daniels, [place] [phone]  Pt is from home alone, however she has a brother that stops by daily at 5:30 am and stays until 5:30 pm. Niece [name] Daniels states that pt  will most likely stay @ nieces house when stable for d/c. [facility].  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 Per MD notes- Pt receiving IV vancomycin as 1 of 2 blood cultures are positive for gram+ cocci in clusters. Plan for ID & GI consult. CM will continue to monitor for disposition needs.

## 2014-03-01 NOTE — Progress Notes (Signed)
ANTICOAGULATION CONSULT NOTE   Pharmacy Consult for couamdin Indication: atrial fibrillation  Allergies  Allergen Reactions  . Levaquin [Levofloxacin In D5w] Nausea And Vomiting    Patient Measurements: Height: 4\' 11"  (149.9 cm) Weight: 115 lb 10.4 oz (52.457 kg) IBW/kg (Calculated) : 43.2   Vital Signs: Temp: 98 F (36.7 C) (03/07 0630) Temp src: Oral (03/06 2057) BP: 150/46 mmHg (03/07 0630) Pulse Rate: 72 (03/07 0630)  Labs:  Recent Labs  02/27/14 0342 02/28/14 0500 03/01/14 0453  LABPROT 14.4 15.8* 15.8*  INR 1.14 1.29 1.29    Estimated Creatinine Clearance: 35.5 ml/min (by C-G formula based on Cr of 0.92).   Medical History: Past Medical History  Diagnosis Date  . Atrial fibrillation     a. Dx 12/2011. b. Adm at [facility] 2014 placed on sotalol but had bradycardia with this. c. Adm 06/2013 with AF-RVR - due to h/o bradycardia, underwent Medtronic ppm & amiodarone initiation (spont conv to NSR).   [name]. GERD (gastroesophageal reflux disease)   . Stroke, lacunar-identified by CT scanning 01/01/2012  . CHF (congestive heart failure)     a. [facility].  [name]. Pneumonia 06/2013  . Renal insufficiency     a. noted by labs 06/2013.  . Bacteremia 02/23/2014    gram positive cocci in clusters  . Chronotropic incompetence with sinus node dysfunction 10/18/2013    Medications:  Prescriptions prior to admission  Medication Sig Dispense Refill  . amiodarone (PACERONE) 200 MG tablet Take 1 tablet (200 mg total) by mouth daily.  90 tablet  2  . atorvastatin (LIPITOR) 20 MG tablet Take 10 mg by mouth at bedtime.   30 tablet  11  . ferrous gluconate (FERGON) 325 MG tablet Take 325 mg by mouth daily with breakfast.      . Ferrous Sulfate (IRON) 325 (65 FE) MG TABS Take 65 mg by mouth daily.      . furosemide (LASIX) 40 MG tablet 20 mg daily.      [name]. lisinopril (PRINIVIL,ZESTRIL) 10 MG tablet 10 mg daily.      . vitamin B-12 (CYANOCOBALAMIN) 500 MCG  tablet Take 500 mcg by mouth daily.      [name]. warfarin (COUMADIN) 2 MG tablet Take 2 mg by mouth daily.        80 yo F admitted 02/23/2014   skin swelling and pus formation to pacemaker on L upper chest since yesterday.  Pharmacy consulted to dose vancomycin and continue warfarin  PMH: afib, GERD, CVA, CHF, CKD  XL:KGMWAC:Afib Vitamin K 5mg  po 3/2 at 1843  2mg  daily PTA, therapeutic on admission.  Home dose x 2 days, MD plan to continue home dose and allow INR to rise slowly  NU:UVOZ:skin swelling and pus formation to pacemaker on L upper chest. Her pacemaker was removed 3/3.  Afeb, wbc wnl  3/2 BC 1 of 2 + for CoNS 3/2 wound cs NGF 3/5 BC x 2 ordered  Vanc 3/1>> 3.5 trough, dose increased 750 q24h (for 14d tx)  CV: Afib, stroke hx (2013), CHF Amiodarone, Lipitor  Endo: no DM  Renal: Renal insufficiency, Scr stable, lytes ok  Best practices: Home meds resumed  Goal of Therapy: INR 2-3 Vancomycin trough 12-15 mcg/ml (per [facility])  Plan: Continue Vancomycin 750 mg IV q24h Warfarin 2mg  PO daily Daily INR, follow up vancomycin trough at steady state.  Thank you for allowing pharmacy to be a part of this patients care team.  Lovenia KimJulie Laritza Vokes Pharm.D., BCPS  Clinical Pharmacist 03/01/2014 8:08 AM Pager: [phone] Phone: [phone]

## 2014-03-01 NOTE — Progress Notes (Signed)
Patient ID: [name] Daniels, female   DOB: 1930/08/06, 80 y.o.   MRN: [id number]   Patient Name: [name] Daniels Date of Encounter: 03/01/2014     Principal Problem:   Pacemaker infection Active Problems:   Atrial fibrillation   Stroke, lacunar-identified by CT scanning   HTN (hypertension)   Chronic diastolic heart failure   Pacemaker-Medtronic   CHF (congestive heart failure)   Renal insufficiency   Bacteremia    SUBJECTIVE  No chest pain, sob, or fevers  CURRENT MEDS . amiodarone  200 mg Oral Daily  . vitamin B-12  500 mcg Oral Daily  . ferrous gluconate  325 mg Oral Q breakfast  . furosemide  20 mg Oral Daily  . sodium chloride  3 mL Intravenous Q12H  . vancomycin  750 mg Intravenous Q24H  . warfarin  2 mg Oral q1800  . Warfarin - Pharmacist Dosing Inpatient   Does not apply q1800    OBJECTIVE  Filed Vitals:   02/28/14 0500 02/28/14 1438 02/28/14 2057 03/01/14 0630  BP: 151/52 149/49 141/40 150/46  Pulse: 59 72 69 72  Temp: 99.1 F (37.3 C) 98.4 F (36.9 C) 98.7 F (37.1 C) 98 F (36.7 C)  TempSrc: Oral Oral Oral   Resp: 18 20 20 20   Height:      Weight: 115 lb 10.4 oz (52.457 kg)     SpO2: 95% 93% 95% 99%    Intake/Output Summary (Last 24 hours) at 03/01/14 0925 Last data filed at 02/28/14 1440  Gross per 24 hour  Intake    360 ml  Output      0 ml  Net    360 ml   Filed Weights   02/26/14 0542 02/27/14 0329 02/28/14 0500  Weight: 117 lb 3.2 oz (53.162 kg) 116 lb 13.1 oz (52.988 kg) 115 lb 10.4 oz (52.457 kg)    PHYSICAL EXAM  General: Pleasant, NAD. Neuro: Alert and oriented X 3. Moves all extremities spontaneously. Psych: Normal affect. HEENT:  Normal  Neck: Supple without bruits or JVD. Lungs:  Resp regular and unlabored, CTA. Heart: RRR no s3, s4, or murmurs. Abdomen: Soft, non-tender, non-distended, BS + x 4.  Extremities: No clubbing, cyanosis or edema. DP/PT/Radials 2+ and equal bilaterally.  Accessory Clinical Findings  CBC No  results found for this basename: WBC, NEUTROABS, HGB, HCT, MCV, PLT,  in the last 72 hours Basic Metabolic Panel No results found for this basename: NA, K, CL, CO2, GLUCOSE, BUN, CREATININE, CALCIUM, MG, PHOS,  in the last 72 hours Liver Function Tests No results found for this basename: AST, ALT, ALKPHOS, BILITOT, PROT, ALBUMIN,  in the last 72 hours No results found for this basename: LIPASE, AMYLASE,  in the last 72 hours Cardiac Enzymes No results found for this basename: CKTOTAL, CKMB, CKMBINDEX, TROPONINI,  in the last 72 hours BNP No components found with this basename: POCBNP,  D-Dimer No results found for this basename: DDIMER,  in the last 72 hours Hemoglobin A1C No results found for this basename: HGBA1C,  in the last 72 hours Fasting Lipid Panel No results found for this basename: CHOL, HDL, LDLCALC, TRIG, CHOLHDL, LDLDIRECT,  in the last 72 hours Thyroid Function Tests No results found for this basename: TSH, T4TOTAL, FREET3, T3FREE, THYROIDAB,  in the last 72 hours  TELE  NSR  Radiology/Studies  Dg Chest 2 View  02/26/2014   CLINICAL DATA:  Pacemaker removal  EXAM: CHEST  2 VIEW  COMPARISON:  February 23, 2014  FINDINGS: Pacemaker has been removed. No pneumothorax. There is underlying emphysematous change. There is patchy lung scarring bilaterally, primarily in the left lower lobe. There is a small left effusion which is stable. There is no new opacity. Heart size is upper normal with normal pulmonary vascularity. There is atherosclerotic change in the aorta. No adenopathy. There is anterior wedging of a lower thoracic vertebral body, stable.  IMPRESSION: Pacemaker removed. No pneumothorax. Small left effusion, stable. Underlying emphysema with areas of scarring. No airspace consolidation.   Electronically Signed   By: Bretta BangWilliam  Woodruff M.D.   On: 02/26/2014 07:31   X-ray Chest Pa And Lateral   02/23/2014   CLINICAL DATA:  Pacemaker infection.  Evaluate pacemaker leads.  EXAM:  CHEST  2 VIEW  COMPARISON:  09/11/2013  FINDINGS: Sequential pacemaker leads project within the right atrium right ventricle, stable from the prior study.  Lungs are hyperexpanded. There is upper lobe scarring. Chronic opacity is noted at the left lung base consistent with a combination of a small chronic effusion with atelectasis/scarring. No convincing infiltrate. No pulmonary edema.  Cardiac silhouette is mildly enlarged. Mediastinum is normal in contour.  IMPRESSION: 1. No acute cardiopulmonary disease 2. Pacer leads are well positioned and stable from the prior study.   Electronically Signed   By: Amie Portlandavid  Ormond M.D.   On: 02/23/2014 21:53   Dg Esophagus  02/28/2014   CLINICAL DATA:  Unable to pass a TEE probe. Question stricture versus spasm.  EXAM: ESOPHOGRAM/BARIUM SWALLOW  TECHNIQUE: Single contrast examination was performed using thin barium or water soluble.  COMPARISON:  None.  FLUOROSCOPY TIME:  29 seconds  FINDINGS: Examination was performed in the semi-upright position. This is a limited evaluation of the esophagus. Contrast flowed freely through the esophagus without evidence of stricture or mass. There was grossly normal esophageal mucosa without evidence of irregularity or ulceration. There is mild intermittent spasm of the distal esophagus just proximal to the gastroesophageal junction. . No evidence of reflux. No definite hiatal hernia was demonstrated.  At the end of the examination a 12.665mm barium tablet was administered which transited through the esophagus and esophagogastric junction without delay.  IMPRESSION: 1. Limited examination esophagus in the semi-upright position demonstrates no esophageal stricture. There is normal transit of a barium tablet.   Electronically Signed   By: Elige KoHetal  Patel   On: 02/28/2014 08:34   Dg Chest Port 1 View  03/01/2014   CLINICAL DATA:  Evaluate PICC line placement.  EXAM: PORTABLE CHEST - 1 VIEW  COMPARISON:  02/26/2014  FINDINGS: Right arm PICC line has  been placed. Catheter tip in the lower SVC region. Heart size is stable and within normal limits. Again noted are patchy parenchymal lung densities bilaterally. Chronic calcification at the lung apices. Persistent densities at the left lung base.  IMPRESSION: PICC line tip in the SVC region.  No significant change in the parenchymal lung densities.   Electronically Signed   By: Richarda OverlieAdam  Henn M.D.   On: 03/01/2014 09:07    ASSESSMENT AND PLAN 1. PPM infection, s/p removal Rec: ok for discharge home. Her daughter has been taught give her the IV vanco for 14 days but is not happy about having to do this but states that she will try. I suspect she will need some health at some point.  Gregg Taylor,M.D.  03/01/2014 9:25 AM

## 2014-03-01 NOTE — Progress Notes (Signed)
Peripherally Inserted Central Catheter/Midline Placement  The IV Nurse has discussed with the patient and/or persons authorized to consent for the patient, the purpose of this procedure and the potential benefits and risks involved with this procedure.  The benefits include less needle sticks, lab draws from the catheter and patient may be discharged home with the catheter.  Risks include, but not limited to, infection, bleeding, blood clot (thrombus formation), and puncture of an artery; nerve damage and irregular heat beat.  Alternatives to this procedure were also discussed.   PICC/Midline Placement Documentation  PICC / Midline Single Lumen 03/01/14 PICC Right Basilic 36 cm 0 cm (Active)  Indication for Insertion or Continuance of Line Home intravenous therapies (PICC only) 03/01/2014  8:31 AM  Exposed Catheter (cm) 0 cm 03/01/2014  8:31 AM  Line Status Flushed;Saline locked;Blood return noted 03/01/2014  8:31 AM  Dressing Change Due 03/19/2014 03/01/2014  8:31 AM       Ethelda Chickurrie, Yulissa Needham Robert 03/01/2014, 8:33 AM

## 2014-03-02 LAB — CULTURE, BLOOD (ROUTINE X 2): CULTURE: NO GROWTH

## 2014-03-03 ENCOUNTER — Ambulatory Visit (INDEPENDENT_AMBULATORY_CARE_PROVIDER_SITE_OTHER): Payer: Commercial Managed Care - HMO | Admitting: *Deleted

## 2014-03-03 ENCOUNTER — Encounter (INDEPENDENT_AMBULATORY_CARE_PROVIDER_SITE_OTHER): Payer: Medicare HMO

## 2014-03-03 DIAGNOSIS — I4891 Unspecified atrial fibrillation: Secondary | ICD-10-CM

## 2014-03-03 NOTE — Progress Notes (Signed)
Wound irrigated with NSS.  3 inches of iodoform removed and wound expressed for a small amount of purulent drainage. Tegaderm dressing applied.  The patient will be seen again 03/06/14 for another dressing change.

## 2014-03-04 ENCOUNTER — Encounter (HOSPITAL_COMMUNITY): Payer: Self-pay | Admitting: *Deleted

## 2014-03-05 ENCOUNTER — Encounter (HOSPITAL_COMMUNITY): Payer: Self-pay | Admitting: Emergency Medicine

## 2014-03-05 ENCOUNTER — Emergency Department (HOSPITAL_COMMUNITY): Payer: Medicare HMO

## 2014-03-05 ENCOUNTER — Inpatient Hospital Stay (HOSPITAL_COMMUNITY)
Admission: EM | Admit: 2014-03-05 | Discharge: 2014-03-26 | DRG: 208 | Disposition: E | Payer: Medicare HMO | Attending: Pulmonary Disease | Admitting: Pulmonary Disease

## 2014-03-05 DIAGNOSIS — Z515 Encounter for palliative care: Secondary | ICD-10-CM

## 2014-03-05 DIAGNOSIS — J9819 Other pulmonary collapse: Secondary | ICD-10-CM | POA: Diagnosis present

## 2014-03-05 DIAGNOSIS — T17508A Unspecified foreign body in bronchus causing other injury, initial encounter: Secondary | ICD-10-CM | POA: Diagnosis present

## 2014-03-05 DIAGNOSIS — N390 Urinary tract infection, site not specified: Secondary | ICD-10-CM

## 2014-03-05 DIAGNOSIS — I495 Sick sinus syndrome: Secondary | ICD-10-CM | POA: Diagnosis present

## 2014-03-05 DIAGNOSIS — E876 Hypokalemia: Secondary | ICD-10-CM | POA: Diagnosis present

## 2014-03-05 DIAGNOSIS — I4891 Unspecified atrial fibrillation: Secondary | ICD-10-CM

## 2014-03-05 DIAGNOSIS — I498 Other specified cardiac arrhythmias: Secondary | ICD-10-CM

## 2014-03-05 DIAGNOSIS — R6521 Severe sepsis with septic shock: Secondary | ICD-10-CM

## 2014-03-05 DIAGNOSIS — J90 Pleural effusion, not elsewhere classified: Secondary | ICD-10-CM

## 2014-03-05 DIAGNOSIS — R0602 Shortness of breath: Secondary | ICD-10-CM

## 2014-03-05 DIAGNOSIS — J988 Other specified respiratory disorders: Secondary | ICD-10-CM

## 2014-03-05 DIAGNOSIS — I6381 Other cerebral infarction due to occlusion or stenosis of small artery: Secondary | ICD-10-CM | POA: Diagnosis present

## 2014-03-05 DIAGNOSIS — Z45018 Encounter for adjustment and management of other part of cardiac pacemaker: Secondary | ICD-10-CM

## 2014-03-05 DIAGNOSIS — J9600 Acute respiratory failure, unspecified whether with hypoxia or hypercapnia: Secondary | ICD-10-CM | POA: Diagnosis present

## 2014-03-05 DIAGNOSIS — R7881 Bacteremia: Secondary | ICD-10-CM | POA: Diagnosis present

## 2014-03-05 DIAGNOSIS — J9601 Acute respiratory failure with hypoxia: Secondary | ICD-10-CM

## 2014-03-05 DIAGNOSIS — I5032 Chronic diastolic (congestive) heart failure: Secondary | ICD-10-CM | POA: Diagnosis present

## 2014-03-05 DIAGNOSIS — I10 Essential (primary) hypertension: Secondary | ICD-10-CM | POA: Diagnosis present

## 2014-03-05 DIAGNOSIS — Y831 Surgical operation with implant of artificial internal device as the cause of abnormal reaction of the patient, or of later complication, without mention of misadventure at the time of the procedure: Secondary | ICD-10-CM | POA: Diagnosis present

## 2014-03-05 DIAGNOSIS — Z66 Do not resuscitate: Secondary | ICD-10-CM

## 2014-03-05 DIAGNOSIS — A419 Sepsis, unspecified organism: Secondary | ICD-10-CM

## 2014-03-05 DIAGNOSIS — J690 Pneumonitis due to inhalation of food and vomit: Principal | ICD-10-CM

## 2014-03-05 DIAGNOSIS — I509 Heart failure, unspecified: Secondary | ICD-10-CM

## 2014-03-05 DIAGNOSIS — T827XXA Infection and inflammatory reaction due to other cardiac and vascular devices, implants and grafts, initial encounter: Secondary | ICD-10-CM | POA: Diagnosis present

## 2014-03-05 DIAGNOSIS — N289 Disorder of kidney and ureter, unspecified: Secondary | ICD-10-CM | POA: Diagnosis present

## 2014-03-05 DIAGNOSIS — J189 Pneumonia, unspecified organism: Secondary | ICD-10-CM | POA: Diagnosis present

## 2014-03-05 DIAGNOSIS — I6350 Cerebral infarction due to unspecified occlusion or stenosis of unspecified cerebral artery: Secondary | ICD-10-CM | POA: Diagnosis present

## 2014-03-05 DIAGNOSIS — Z8673 Personal history of transient ischemic attack (TIA), and cerebral infarction without residual deficits: Secondary | ICD-10-CM

## 2014-03-05 DIAGNOSIS — D649 Anemia, unspecified: Secondary | ICD-10-CM | POA: Diagnosis present

## 2014-03-05 DIAGNOSIS — Z7901 Long term (current) use of anticoagulants: Secondary | ICD-10-CM

## 2014-03-05 DIAGNOSIS — N179 Acute kidney failure, unspecified: Secondary | ICD-10-CM

## 2014-03-05 LAB — BASIC METABOLIC PANEL
BUN: 28 mg/dL — ABNORMAL HIGH (ref 6–23)
CO2: 23 meq/L (ref 19–32)
Calcium: 8.5 mg/dL (ref 8.4–10.5)
Chloride: 95 mEq/L — ABNORMAL LOW (ref 96–112)
Creatinine, Ser: 1.35 mg/dL — ABNORMAL HIGH (ref 0.50–1.10)
GFR calc Af Amer: 41 mL/min — ABNORMAL LOW (ref >90)
GFR calc non Af Amer: 36 mL/min — ABNORMAL LOW (ref >90)
Glucose, Bld: 121 mg/dL — ABNORMAL HIGH (ref 70–99)
Potassium: 3.2 mEq/L — ABNORMAL LOW (ref 3.7–5.3)
SODIUM: 136 meq/L — AB (ref 137–147)

## 2014-03-05 LAB — VANCOMYCIN, RANDOM: Vancomycin Rm: 19.1 ug/mL

## 2014-03-05 LAB — CBC WITH DIFFERENTIAL/PLATELET
BASOS ABS: 0 10*3/uL (ref 0.0–0.1)
Basophils Relative: 0 % (ref 0–1)
Eosinophils Absolute: 0 10*3/uL (ref 0.0–0.7)
Eosinophils Relative: 0 % (ref 0–5)
HEMATOCRIT: 31.2 % — AB (ref 36.0–46.0)
Hemoglobin: 10.8 g/dL — ABNORMAL LOW (ref 12.0–15.0)
LYMPHS ABS: 0.8 10*3/uL (ref 0.7–4.0)
Lymphocytes Relative: 5 % — ABNORMAL LOW (ref 12–46)
MCH: 31.8 pg (ref 26.0–34.0)
MCHC: 34.6 g/dL (ref 30.0–36.0)
MCV: 91.8 fL (ref 78.0–100.0)
Monocytes Absolute: 1.9 10*3/uL — ABNORMAL HIGH (ref 0.1–1.0)
Monocytes Relative: 12 % (ref 3–12)
NEUTROS ABS: 12.6 10*3/uL — AB (ref 1.7–7.7)
Neutrophils Relative %: 83 % — ABNORMAL HIGH (ref 43–77)
Platelets: 248 10*3/uL (ref 150–400)
RBC: 3.4 MIL/uL — AB (ref 3.87–5.11)
RDW: 15.5 % (ref 11.5–15.5)
WBC: 15.3 10*3/uL — ABNORMAL HIGH (ref 4.0–10.5)

## 2014-03-05 LAB — CULTURE, BLOOD (ROUTINE X 2)
CULTURE: NO GROWTH
Culture: NO GROWTH

## 2014-03-05 LAB — I-STAT TROPONIN, ED: TROPONIN I, POC: 0.01 ng/mL (ref 0.00–0.08)

## 2014-03-05 LAB — I-STAT CG4 LACTIC ACID, ED: Lactic Acid, Venous: 0.97 mmol/L (ref 0.5–2.2)

## 2014-03-05 LAB — PROTIME-INR
INR: 1.5 — AB (ref 0.00–1.49)
PROTHROMBIN TIME: 17.7 s — AB (ref 11.6–15.2)

## 2014-03-05 LAB — PRO B NATRIURETIC PEPTIDE: PRO B NATRI PEPTIDE: 2930 pg/mL — AB (ref 0–450)

## 2014-03-05 MED ORDER — WARFARIN SODIUM 4 MG PO TABS
4.0000 mg | ORAL_TABLET | Freq: Once | ORAL | Status: AC
  Administered 2014-03-05: 4 mg via ORAL
  Filled 2014-03-05: qty 1

## 2014-03-05 MED ORDER — SODIUM CHLORIDE 0.9 % IV SOLN
INTRAVENOUS | Status: DC
  Administered 2014-03-05: 75 mL/h via INTRAVENOUS
  Administered 2014-03-08: 12:00:00 via INTRAVENOUS

## 2014-03-05 MED ORDER — AMIODARONE HCL 200 MG PO TABS
200.0000 mg | ORAL_TABLET | Freq: Every day | ORAL | Status: DC
  Administered 2014-03-06 – 2014-03-08 (×3): 200 mg via ORAL
  Filled 2014-03-05 (×4): qty 1

## 2014-03-05 MED ORDER — WARFARIN - PHARMACIST DOSING INPATIENT: Freq: Every day | Status: DC

## 2014-03-05 MED ORDER — VITAMIN B-12 500 MCG PO TABS: 500.0000 ug | ORAL_TABLET | Freq: Every day | ORAL | Status: DC

## 2014-03-05 MED ORDER — POTASSIUM CHLORIDE CRYS ER 20 MEQ PO TBCR
40.0000 meq | EXTENDED_RELEASE_TABLET | Freq: Once | ORAL | Status: AC
  Administered 2014-03-06: 40 meq via ORAL
  Filled 2014-03-05: qty 2

## 2014-03-05 MED ORDER — LISINOPRIL 10 MG PO TABS
10.0000 mg | ORAL_TABLET | Freq: Every day | ORAL | Status: DC
  Administered 2014-03-06: 10 mg via ORAL
  Filled 2014-03-05 (×3): qty 1

## 2014-03-05 MED ORDER — FUROSEMIDE 20 MG PO TABS
20.0000 mg | ORAL_TABLET | Freq: Every day | ORAL | Status: DC
  Administered 2014-03-06: 20 mg via ORAL
  Filled 2014-03-05 (×3): qty 1

## 2014-03-05 MED ORDER — VANCOMYCIN HCL IN DEXTROSE 750-5 MG/150ML-% IV SOLN
750.0000 mg | INTRAVENOUS | Status: DC
  Administered 2014-03-05 – 2014-03-07 (×3): 750 mg via INTRAVENOUS
  Filled 2014-03-05 (×4): qty 150

## 2014-03-05 MED ORDER — ACETYLCYSTEINE 20 % IN SOLN
3.0000 mL | RESPIRATORY_TRACT | Status: DC
  Administered 2014-03-05 – 2014-03-06 (×7): 3 mL via RESPIRATORY_TRACT
  Administered 2014-03-07: 18:00:00 via RESPIRATORY_TRACT
  Administered 2014-03-07 – 2014-03-08 (×7): 3 mL via RESPIRATORY_TRACT
  Administered 2014-03-08: 12:00:00 via RESPIRATORY_TRACT
  Administered 2014-03-08: 3 mL via RESPIRATORY_TRACT
  Filled 2014-03-05 (×23): qty 4

## 2014-03-05 MED ORDER — ALBUTEROL SULFATE (2.5 MG/3ML) 0.083% IN NEBU
2.5000 mg | INHALATION_SOLUTION | RESPIRATORY_TRACT | Status: DC
  Administered 2014-03-06 – 2014-03-08 (×16): 2.5 mg via RESPIRATORY_TRACT
  Filled 2014-03-05 (×16): qty 3

## 2014-03-05 MED ORDER — ENOXAPARIN SODIUM 40 MG/0.4ML ~~LOC~~ SOLN: 40.0000 mg | SUBCUTANEOUS | Status: DC

## 2014-03-05 MED ORDER — ATORVASTATIN CALCIUM 20 MG PO TABS
20.0000 mg | ORAL_TABLET | Freq: Every day | ORAL | Status: DC
  Administered 2014-03-05 – 2014-03-07 (×2): 20 mg via ORAL
  Filled 2014-03-05 (×5): qty 1

## 2014-03-05 MED ORDER — ALBUTEROL SULFATE (2.5 MG/3ML) 0.083% IN NEBU
2.5000 mg | INHALATION_SOLUTION | RESPIRATORY_TRACT | Status: DC | PRN
  Administered 2014-03-05: 2.5 mg via RESPIRATORY_TRACT
  Filled 2014-03-05: qty 3

## 2014-03-05 MED ORDER — CEFEPIME HCL 1 G IJ SOLR
1.0000 g | Freq: Three times a day (TID) | INTRAMUSCULAR | Status: DC
  Filled 2014-03-05 (×2): qty 1

## 2014-03-05 MED ORDER — CYANOCOBALAMIN 500 MCG PO TABS
500.0000 ug | ORAL_TABLET | Freq: Every day | ORAL | Status: DC
  Administered 2014-03-06 – 2014-03-07 (×2): 500 ug via ORAL
  Administered 2014-03-08: 09:00:00 via ORAL
  Filled 2014-03-05 (×3): qty 1

## 2014-03-05 MED ORDER — FERROUS SULFATE 325 (65 FE) MG PO TABS
325.0000 mg | ORAL_TABLET | Freq: Every day | ORAL | Status: DC
  Administered 2014-03-06 – 2014-03-08 (×3): 325 mg via ORAL
  Filled 2014-03-05 (×4): qty 1

## 2014-03-05 MED ORDER — CEFEPIME HCL 1 G IJ SOLR
1.0000 g | INTRAMUSCULAR | Status: DC
  Administered 2014-03-05 – 2014-03-07 (×3): 1 g via INTRAVENOUS
  Filled 2014-03-05 (×4): qty 1

## 2014-03-05 NOTE — ED Notes (Signed)
History of pacemaker removal due to infection one week ago.  Developed shortness of breath and hypoxemia. Seen at [facility] states oxygen level dropped to 76%. Patient denies shortness of breath and no distress or pain. EMS report EKG NSR with pulse oximetry 87% room air and increased to 92% with 2l [place].

## 2014-03-05 NOTE — Progress Notes (Addendum)
ANTIBIOTIC CONSULT NOTE - INITIAL  Pharmacy Consult for cefepime; Add Vanc, Coumadin Indication: bacteremia; r/o HCAP, Afib  Allergies  Allergen Reactions  . Levaquin [Levofloxacin In D5w] Nausea And Vomiting    Patient Measurements: Height: 4' 11.06" (150 cm) Weight: 115 lb 11.9 oz (52.5 kg) IBW/kg (Calculated) : 43.33  Vital Signs: Temp: 97.2 F (36.2 C) (03/11 1208) Temp src: Oral (03/11 1208) BP: 142/53 mmHg (03/11 1208) Pulse Rate: 76 (03/11 1208)  Labs:  Recent Labs  03/06/2014 1226  WBC 15.3*  HGB 10.8*  PLT 248  CREATININE 1.35*   Estimated Creatinine Clearance: 24.2 ml/min (by C-G formula based on Cr of 1.35). No results found for this basename: VANCOTROUGH, Leodis BinetVANCOPEAK, VANCORANDOM, GENTTROUGH, GENTPEAK, GENTRANDOM, TOBRATROUGH, TOBRAPEAK, TOBRARND, AMIKACINPEAK, AMIKACINTROU, AMIKACIN,  in the last 72 hours   Microbiology: Recent Results (from the past 720 hour(s))  CULTURE, BLOOD (ROUTINE X 2)     Status: None   Collection Time    02/23/14  4:56 PM      Result Value Ref Range Status   Specimen Description BLOOD RIGHT HAND   Final   Special Requests BOTTLES DRAWN AEROBIC ONLY 5CC   Final   Culture  Setup Time     Final   Value: 02/24/2014 03:29     Performed at [facility]   Culture     Final   Value: NO GROWTH 5 DAYS     Performed at [facility]   Report Status 03/02/2014 FINAL   Final  CULTURE, BLOOD (ROUTINE X 2)     Status: None   Collection Time    02/23/14  5:42 PM      Result Value Ref Range Status   Specimen Description BLOOD BLOOD RIGHT FOREARM   Final   Special Requests BOTTLES DRAWN AEROBIC AND ANAEROBIC 5CC EACH   Final   Culture  Setup Time     Final   Value: 02/24/2014 03:29     Performed at [facility]   Culture     Final   Value: STAPHYLOCOCCUS SPECIES (COAGULASE NEGATIVE)     Note: THE SIGNIFICANCE OF ISOLATING THIS ORGANISM FROM A SINGLE SET OF BLOOD CULTURES WHEN MULTIPLE SETS ARE DRAWN IS UNCERTAIN.  PLEASE NOTIFY THE MICROBIOLOGY DEPARTMENT WITHIN ONE WEEK IF SPECIATION AND SENSITIVITIES ARE REQUIRED.     Note: Gram Stain Report Called to,Read Back By and Verified With: ASHLEY EISMA ON 02/24/2014 AT 8:16P BY WILEJ     Performed at [facility]   Report Status 02/27/2014 FINAL   Final  WOUND CULTURE     Status: None   Collection Time    02/24/14  6:10 PM      Result Value Ref Range Status   Specimen Description WOUND   Final   Special Requests SUBCLAVIAN PACEMAKER POCKET   Final   Gram Stain     Final   Value: NO WBC SEEN     NO SQUAMOUS EPITHELIAL CELLS SEEN     NO ORGANISMS SEEN     Performed at [facility]   Culture     Final   Value: NO GROWTH 3 DAYS     Performed at [facility]   Report Status 02/27/2014 FINAL   Final  CULTURE, BLOOD (ROUTINE X 2)     Status: None   Collection Time    02/27/14  9:15 AM      Result Value Ref Range Status   Specimen Description BLOOD RIGHT HAND  Final   Special Requests BOTTLES DRAWN AEROBIC ONLY 5CC   Final   Culture  Setup Time     Final   Value: 02/27/2014 14:50     Performed at [facility]   Culture     Final   Value: NO GROWTH 5 DAYS     Performed at [facility]   Report Status 03/29/14 FINAL   Final  CULTURE, BLOOD (ROUTINE X 2)     Status: None   Collection Time    02/27/14  9:30 AM      Result Value Ref Range Status   Specimen Description BLOOD LEFT HAND   Final   Special Requests BOTTLES DRAWN AEROBIC ONLY 3CC   Final   Culture  Setup Time     Final   Value: 02/27/2014 14:50     Performed at [facility]   Culture     Final   Value: NO GROWTH 5 DAYS     Performed at [facility]   Report Status 03/29/14 FINAL   Final    Medical History: Past Medical History  Diagnosis Date  . Atrial fibrillation     a. Dx 12/2011. b. Adm at [facility] 2014 placed on sotalol but had bradycardia with this. c. Adm 06/2013 with AF-RVR - due to h/o bradycardia, underwent Medtronic ppm &  amiodarone initiation (spont conv to NSR).   [name] GERD (gastroesophageal reflux disease)   . Stroke, lacunar-identified by CT scanning 01/01/2012  . CHF (congestive heart failure)     a. Admission for HFpEF at [facility] ~06/2013.  [name] Pneumonia 06/2013  . Renal insufficiency     a. noted by labs 06/2013.  . Bacteremia 02/23/2014    gram positive cocci in clusters  . Chronotropic incompetence with sinus node dysfunction 10/18/2013   Assessment: 80 yo F with history of pacemaker removal d/t infection (one week prior) presenting to ED after SOB and hypoxemia with Osat dropping to 76% at outpatient physician visit.  Pharmacy consulted to dose cefepime for bacteremia.  Initial labs reveal elevated WBC 15.3 and SCr 1.53 with estimated CrCl ~24.  Patient is currently afebrile.  Multiple recent blood cultures have been drawn since 3/1 with no growth except 1/2 CONS on 3/1. Culture from pacemaker pocket on 3/2 shows no growth.  Goal of Therapy:  Resolution of infection  Plan:  - start cefepime IV 1gm q24h  - monitor kidney function, WBC, temperature curve, any cultures, and clinical progression  Harrold Donath E. Achilles Dunk, PharmD Clinical Pharmacist - Resident Pager: [phone] Pharmacy: [phone] Mar 29, 2014 1:37 PM   Addendum: Patient was on vancomycin PTA for her bacteremia/pacer pocket infection. She was to complete a total of 14 days (today would be day #10/14). Her dose last admission of 500mg  q24 gave a subtherapeutic trough. Dose was increased to 750mg  q24 on 3/5 and a random vancomycin level drawn today is therapeutic at 19.1 as there is now concern for HCAP. Patient's daughter reports that she usually gives the vancomycin between 5 and 8pm with last dose given yesterday.  Patient is also on coumadin pta for afib. INR on admission is 1.5 (daughter reports she forgot to give her a dose this past week). Coumadin to resume. Home dose is 2mg  daily with last dose taken 3/10.   Goals: Vancomycin  trough 15-20 INR 2-3  Plan: 1) Continue vancomycin 750mg  IV q24 2) Coumadin 4mg  x 1 tonight 3) Daily INR  Louie Casa, PharmD, BCPS 03/29/2014, 5:43 PM

## 2014-03-05 NOTE — ED Provider Notes (Signed)
Medical screening examination/treatment/procedure(s) were conducted as a shared visit with non-physician practitioner(s) and myself.  I personally evaluated the patient during the encounter.   EKG Interpretation None      80 year old female being treated for a pacemaker pocket infection with IV Vanco who presents with shortness of breath for the past several days. On exam, well-appearing, nontoxic, afebrile, no respiratory distress on nasal cannula oxygen, decreased lung sounds on left compared to right. Chest x-ray demonstrates whiteout of left lung. Plan admission to medicine.  Clinical Impression: 1. SOB (shortness of breath)   2. HCAP (healthcare-associated pneumonia)   3. Pleural effusion on left   4. Acute respiratory failure with hypoxia   5. Airway obstruction, anatomic   6. Aspiration pneumonia       Candyce ChurnJohn David Aharon Carriere III, MD 03/06/14 [phone]

## 2014-03-05 NOTE — ED Notes (Signed)
Called IV team and received permission to use PICC line for IV infusion and blood draws. They stated since [facility] that we did not need to perform an x-ray to verify placement prior to using line for ABX infusions

## 2014-03-05 NOTE — ED Notes (Signed)
Note: first cultures pulled off of PICC line

## 2014-03-05 NOTE — Consult Note (Signed)
PULMONARY / CRITICAL CARE MEDICINE  Name: [name] DresserBetty Daniels MRN: [id number] DOB: Aug 30, 1930    ADMISSION DATE:  03/10/2014 CONSULTATION DATE:  03/10/2014  REFERRING MD :  EDP PRIMARY SERVICE:  [facility]  CHIEF COMPLAINT:  SOB  BRIEF PATIENT DESCRIPTION: 80 yo with recent hospitalization for bacteremia secondary to infected pacemaker, sent to ED from PCP office for dyspnea and hypoxia.  Chest imaging demonstrated L lung volume loss with L mainstem bronchus cut off sign and associated pleural effusion.  SIGNIFICANT EVENTS / STUDIES:  3/11 CT Chest >>> Occluded left mainstem bronchus with complete collapse of left lung and associated effusion  LINES / TUBES:  CULTURES: 3/11 Blood >>>  ANTIBIOTICS: Cefepime 3/11 >>>  HISTORY OF PRESENT ILLNESS:  Note,  This HPI is limited as patient is not a reliable historian. Mrs. [name] Daniels is an 80 year old female with history of atrial fibrillation currently on warfarin, CHF, renal insufficiency, recently infected pacemaker causing bacteremia which was recently removed this past month who presents complaining of shortness of breath.  Medtronic device was removed secondary to infxn with gram positive cocci in clusters.  Patient was sent here from her PCP office after a followup visit today. Patient has been complaining of difficulty breathing both while resting and with walking. She reports having increased abdominal swelling. She was found to have sats of 76% on room air while ambulating. She has also complaining of vomiting daily for the past 5 days.   She has been on vancomycin. Patient does complain of intermittent shortness of breath but unable to describe much. She however denies fever chills, headache, chest pain, palpitations, N/V/D, abdominal pain, numbness or weakness.  PAST MEDICAL HISTORY :  Past Medical History  Diagnosis Date  . Atrial fibrillation     a. Dx 12/2011. b. Adm at [facility] 2014 placed on sotalol but had bradycardia with this. c. Adm 06/2013 with  AF-RVR - due to h/o bradycardia, underwent Medtronic ppm & amiodarone initiation (spont conv to NSR).   [name]. GERD (gastroesophageal reflux disease)   . Stroke, lacunar-identified by CT scanning 01/01/2012  . CHF (congestive heart failure)     a. [facility] ~06/2013.  [name]. Pneumonia 06/2013  . Renal insufficiency     a. noted by labs 06/2013.  . Bacteremia 02/23/2014    gram positive cocci in clusters  . Chronotropic incompetence with sinus node dysfunction 10/18/2013   Past Surgical History  Procedure Laterality Date  . Tonsillectomy  ~ 1950  . Cataract extraction w/ intraocular lens implant Left 07/08/2013  . Pacemaker insertion  07-10-2013    MDT ADDRL1 pacemaker implanted by Dr Graciela HusbandsKlein for tachy-brady syndrome  . Pacemaker removal  02/25/14    Pacemaker system removed by Dr Johney FrameAllred for bacteremia  . Tee without cardioversion N/A 02/26/2014    Procedure: TRANSESOPHAGEAL ECHOCARDIOGRAM (TEE);  Surgeon: Chrystie NoseKenneth C. Hilty, MD;  [facility];  Service: Cardiovascular;  Laterality: N/A;   Prior to Admission medications   Medication Sig Start Date End Date Taking? Authorizing Provider  amiodarone (PACERONE) 200 MG tablet Take 1 tablet (200 mg total) by mouth daily. 12/05/13  Yes Duke SalviaSteven C Klein, MD  atorvastatin (LIPITOR) 20 MG tablet Take 20 mg by mouth at bedtime.  07/02/13  Yes Duke SalviaSteven C Klein, MD  ferrous sulfate 325 (65 FE) MG tablet Take 325 mg by mouth daily with breakfast.   Yes Historical Provider, MD  furosemide (LASIX) 20 MG tablet Take 20 mg by mouth daily.   Yes Historical  Provider, MD  lisinopril (PRINIVIL,ZESTRIL) 10 MG tablet Take 10 mg by mouth daily.  08/05/13  Yes Historical Provider, MD  sodium chloride 0.9 % SOLN 150 mL with vancomycin 1000 MG SOLR 750 mg Inject 750 mg into the vein daily.   Yes Historical Provider, MD  vitamin B-12 (CYANOCOBALAMIN) 500 MCG tablet Take 500 mcg by mouth daily.   Yes Historical Provider, MD  warfarin (COUMADIN) 2 MG tablet  Take 2 mg by mouth daily.   Yes Historical Provider, MD   Allergies  Allergen Reactions  . Levaquin [Levofloxacin In D5w] Nausea And Vomiting   FAMILY HISTORY:  No family history on file.  SOCIAL HISTORY:  reports that she has never smoked. She has never used smokeless tobacco. She reports that she does not drink alcohol or use illicit drugs.  REVIEW OF SYSTEMS:   Negative except as stated in HPI.  INTERVAL HISTORY:  VITAL SIGNS: Temp:  [97.2 F (36.2 C)] 97.2 F (36.2 C) (03/11 1208) Pulse Rate:  [70-76] 74 (03/11 1450) Resp:  [13-22] 13 (03/11 1450) BP: (107-142)/(29-53) 127/49 mmHg (03/11 1450) SpO2:  [85 %-100 %] 100 % (03/11 1450) Weight:  [115 lb 11.9 oz (52.5 kg)] 115 lb 11.9 oz (52.5 kg) (03/11 1300)  PHYSICAL EXAMINATION: General: Elderly female, resting in bed, in NAD. Neuro: Alert to person but not place or time.    HEENT: [place]/AT. PERRL, sclerae anicteric. Cardiovascular: IRIR, no M/R/G. Dressing on left chest C/D/I. (from pacemaker removal), non-tender. Lungs: Respirations even and unlabored.  CTA on right, absent on left. No W/R/R.  Abdomen: BS x 4, soft, NT/ND.  Musculoskeletal: No gross deformities, no edema.  Skin: Intact, warm, no rashes.  Recent Labs Lab 2014/03/18 1226  NA 136*  K 3.2*  CL 95*  CO2 23  BUN 28*  CREATININE 1.35*  GLUCOSE 121*    Recent Labs Lab 2014-03-18 1226  HGB 10.8*  HCT 31.2*  WBC 15.3*  PLT 248   Ct Chest Wo Contrast  03/18/14   CLINICAL DATA Shortness of breath.  Recent pacemaker removal for infection  EXAM CT CHEST WITHOUT CONTRAST  TECHNIQUE Multidetector CT imaging of the chest was performed following the standard protocol without IV contrast.  COMPARISON 06/26/2013  FINDINGS THORACIC INLET/BODY WALL:  6.4 x 2 x 5 cm collection in the left chest which has mottled lucency. This is presumably a packed wound given the lucency extends through the skin incision. No focal fluid collection within this pocket to suggest  loculation. There is a right upper extremity PICC, tip at the SVC level.  MEDIASTINUM:  Normal heart size. No pericardial effusion. Atherosclerosis, including the coronary arteries. Moderate sliding-type hiatal hernia. No evidence of acute vascular abnormality. No adenopathy.  LUNG WINDOWS:  There is complete opacification of the collapsed left lung. High-density material present within the left mainstem bronchus. This was not seen on the comparison study. There is a moderate left pleural effusion. Small right pleural effusion. Patchy airspace opacity in the right upper lung.  UPPER ABDOMEN:  No acute findings.  OSSEOUS:  Remote T12 compression fracture.  Critical Value/emergent results were called by telephone at the time of interpretation on 2014-03-18 at 3:45 PM to Dr. Fayrene Helper , who verbally acknowledged these results.  IMPRESSION 1. Occluded left mainstem bronchus (high density of this material suggests aspiration over mucoid impaction) with complete collapse of the left lung. 2. Patchy airspace disease in the right upper lobe. The distribution is not typical for aspiration while recumbent, and  may represent pneumonia. 3. Packed pacer pocket in the left chest.  SIGNATURE  Electronically Signed   By: Tiburcio Pea M.D.   On: 03/16/2014 15:47   Dg Chest Port 1 View  03/10/2014   CLINICAL DATA Low pulse ox, shortness of breath  EXAM PORTABLE CHEST - 1 VIEW  COMPARISON DG CHEST 1V PORT dated 03/01/2014  FINDINGS There is a right-sided PICC line with the tip projecting over the SVC. There is interval left lung volume loss and complete white out likely reflecting a combination of atelectatic lung and pleural fluid. There is chronic right lung interstitial thickening. There is no right lung focal parenchymal opacity. There is no pneumothorax. Stable cardiomediastinal silhouette.  The osseous structures are unremarkable.  IMPRESSION Interval left lung volume loss and complete white out likely reflecting a  combination of atelectatic lung and pleural fluid.  SIGNATURE  Electronically Signed   By: Elige Ko   On: 03/17/2014 12:48   ASSESSMENT / PLAN:  Left lung atelectasis, likely secondary to mucus plug, less likely endobronchial lesion Possible HCAP vs aspiration pneumonia given episodes of emesis Left pleural effusion secondary to lung collapse, less likely parapneumonic effusion or empyema Hypoxemic respiratory failure DNR/DNI status  -->  Appears to be appropriate for SDU admission under [facility] -->  Supplemental oxygen to SpO2>92 -->  Encourage coughing / deep breathing -->  Flutter valve -->  Albuterol / Mucomyst Nebs -->  Chest physical therapy to L lung -->  Follow CXR -->  May need flexible bronchoscopy if no improvement with conservative measures -->  No indications for thoracentesis at this time -->  Consider widening abx if aspiration is suspected ( Cefepime has no anaerobic coverage ) -->  Check PCT -->  SLP evaluation   Rutherford Guys, PA - [facility] [place] [facility] Pgr: [phone]  or [phone]  I have personally obtained history, examined patient, evaluated and interpreted laboratory and imaging results, reviewed medical records, formulated assessment / plan and placed orders.  Lonia Farber, MD Pulmonary and Critical Care Medicine [facility] Pager: [phone]  03/23/2014, 8:14 PM

## 2014-03-05 NOTE — H&P (Signed)
[facility] History and Physical  [name] Daniels ZYS:[id number] DOB: [birth year] DOA: 03/14/2014  Referring physician:  PCP: [name] Severin, NP   Chief Complaint: Shortness of breath  HPI: [name] Daniels is a 80 y.o. female with a past medical history of tachybradycardia syndrome, status post pacemaker implant in July of 2014,  atrial fibrillation, on chronic anticoagulation, history of CVA, diastolic congestive heart failure, who was admitted to the cardiology service on 02/23/2014 for pacemaker infection. She was initially started on IV vancomycin, undergoing device exploration on 02/24/2014 which showed significant purulence within pacemaker pocket. Will culture sets as she underwent debridement of pocket. Extraction of pacemaker was performed on 02/25/2014, with removal of pulse generator and atrial and ventricular leads. One out of two blood cultures that were drawn on 02/23/2014 grew coagulase-negative Staphylococcus species. She was seen by infectious disease during that hospitalization as a two-week course of vancomycin was recommended. She was discharged on 02/28/2014 with Vancomycin 750 mg IV every 24 hours with home health services following. Patient reporting the development of nonexertional shortness of breath that started on Monday 03/03/2014 associated with a nonproductive cough. Her daughter present at bedside reported O2 sats in the 43s which improved after a "breathing treatment." She reports associated nausea, vomiting, malaise, generalized weakness. She denies associated chest pain, sputum production, fever, chills, palpitations, syncope. Patient presented to her primary care physician's office with the above mentioned complaints, was found to have oxygen saturations of 76% and referred to the emergency room. Initial chest x-ray showed interval left lung volume loss a complete whiteout likely reflecting a combination of atelectatic lung and pleural fluid. This was further worked up with  a CT scan of chest without contrast which showed  an occluded left main stem bronchus with complete collapse of the left lung. Also noted was patchy airspace disease in the right upper lobe. She was administered cefepime. Patient was evaluated by pulmonary critical care medicine in the emergency room.                                                                                                          Review of Systems:  Constitutional:  No weight loss, night sweats, Fevers, chills,  positive for fatigue.  HEENT:  No headaches, Difficulty swallowing,Tooth/dental problems,Sore throat,  No sneezing, itching, ear ache, nasal congestion, post nasal drip,  Cardio-vascular:  No chest pain, Orthopnea, PND, swelling in lower extremities, anasarca, dizziness, palpitations  GI:  No heartburn, indigestion, abdominal pain, nausea, vomiting, diarrhea, change in bowel habits, loss of appetite  Resp:  Positive for  shortness of breath with exertion or at rest. No excess mucus, no productive cough, No non-productive cough, No coughing up of blood.No change in color of mucus.No wheezing.No chest wall deformity  Skin:  no rash or lesions.  GU:  no dysuria, change in color of urine, no urgency or frequency. No flank pain.  Musculoskeletal:  No joint pain or swelling. No decreased range of motion. No back pain.  Psych:  No change in mood or affect. No depression or anxiety. No  memory loss.   Past Medical History  Diagnosis Date  . Atrial fibrillation     a. Dx 12/2011. b. Adm at [facility] 2014 placed on sotalol but had bradycardia with this. c. Adm 06/2013 with AF-RVR - due to h/o bradycardia, underwent Medtronic ppm & amiodarone initiation (spont conv to NSR).   [name] GERD (gastroesophageal reflux disease)   . Stroke, lacunar-identified by CT scanning 01/01/2012  . CHF (congestive heart failure)     a. Admission for HFpEF at [facility] ~06/2013.  [name] Pneumonia 06/2013  . Renal insufficiency     a. noted by  labs 06/2013.  . Bacteremia 02/23/2014    gram positive cocci in clusters  . Chronotropic incompetence with sinus node dysfunction 10/18/2013   Past Surgical History  Procedure Laterality Date  . Tonsillectomy  ~ 1950  . Cataract extraction w/ intraocular lens implant Left 07/08/2013  . Pacemaker insertion  07-10-2013    MDT ADDRL1 pacemaker implanted by Dr [name] for tachy-brady syndrome  . Pacemaker removal  02/25/14    Pacemaker system removed by Dr Johney Frame for bacteremia  . Tee without cardioversion N/A 02/26/2014    Procedure: TRANSESOPHAGEAL ECHOCARDIOGRAM (TEE);  Surgeon: Chrystie Nose, MD;  Location: [facility] ENDOSCOPY;  Service: Cardiovascular;  Laterality: N/A;   Social History:  reports that she has never smoked. She has never used smokeless tobacco. She reports that she does not drink alcohol or use illicit drugs.  Allergies  Allergen Reactions  . Levaquin [Levofloxacin In D5w] Nausea And Vomiting    No family history on file.   Prior to Admission medications   Medication Sig Start Date End Date Taking? Authorizing Provider  amiodarone (PACERONE) 200 MG tablet Take 1 tablet (200 mg total) by mouth daily. 12/05/13  Yes [name] Salvia, MD  atorvastatin (LIPITOR) 20 MG tablet Take 20 mg by mouth at bedtime.  07/02/13  Yes [name] Salvia, MD  ferrous sulfate 325 (65 FE) MG tablet Take 325 mg by mouth daily with breakfast.   Yes Historical Provider, MD  furosemide (LASIX) 20 MG tablet Take 20 mg by mouth daily.   Yes Historical Provider, MD  lisinopril (PRINIVIL,ZESTRIL) 10 MG tablet Take 10 mg by mouth daily.  08/05/13  Yes Historical Provider, MD  sodium chloride 0.9 % SOLN 150 mL with vancomycin 1000 MG SOLR 750 mg Inject 750 mg into the vein daily.   Yes Historical Provider, MD  vitamin B-12 (CYANOCOBALAMIN) 500 MCG tablet Take 500 mcg by mouth daily.   Yes Historical Provider, MD  warfarin (COUMADIN) 2 MG tablet Take 2 mg by mouth daily.   Yes Historical Provider, MD   Physical  Exam: Filed Vitals:   March 25, 2014 1450  BP: 127/49  Pulse: 74  Temp:   Resp: 13    BP 127/49  Pulse 74  Temp(Src) 97.2 F (36.2 C) (Oral)  Resp 13  Ht 4' 11.06" (1.5 m)  Wt 52.5 kg (115 lb 11.9 oz)  BMI 23.33 kg/m2  SpO2 100%  General:  Patient does not appear to be in acute distress, awake and alert, flat affect, poor historian  Eyes: PERRL, normal lids, irises & conjunctiva ENT: grossly normal hearing, lips & tongue Neck: no LAD, masses or thyromegaly Cardiovascular: RRR, no m/r/g. No LE edema. Telemetry: SR, no arrhythmias  Respiratory: She has significant diminished breath sounds on left, right lung appears to be clear to auscultation without wheezing rhonchi or rales. During my evaluation she is not in respiratory distress, breathing comfortably  on supplemental oxygen.  Abdomen: soft, ntnd Skin: There is a dressing in place over left anterior chest wall, no evidence of purulence or significant drainage.  Musculoskeletal: grossly normal tone BUE/BLE Psychiatric: grossly normal mood and affect, speech fluent and appropriate Neurologic: grossly non-focal.          Labs on Admission:  Basic Metabolic Panel:  Recent Labs Lab 08-04-14 1226  NA 136*  K 3.2*  CL 95*  CO2 23  GLUCOSE 121*  BUN 28*  CREATININE 1.35*  CALCIUM 8.5   Liver Function Tests: No results found for this basename: AST, ALT, ALKPHOS, BILITOT, PROT, ALBUMIN,  in the last 168 hours No results found for this basename: LIPASE, AMYLASE,  in the last 168 hours No results found for this basename: AMMONIA,  in the last 168 hours CBC:  Recent Labs Lab 08-04-14 1226  WBC 15.3*  NEUTROABS 12.6*  HGB 10.8*  HCT 31.2*  MCV 91.8  PLT 248   Cardiac Enzymes: No results found for this basename: CKTOTAL, CKMB, CKMBINDEX, TROPONINI,  in the last 168 hours  BNP (last 3 results)  Recent Labs  07/09/13 1347 08-04-14 1226  PROBNP 649.4* 2930.0*   CBG: No results found for this basename: GLUCAP,  in  the last 168 hours  Radiological Exams on Admission: Ct Chest Wo Contrast  02-07-2014   CLINICAL DATA Shortness of breath.  Recent pacemaker removal for infection  EXAM CT CHEST WITHOUT CONTRAST  TECHNIQUE Multidetector CT imaging of the chest was performed following the standard protocol without IV contrast.  COMPARISON 06/26/2013  FINDINGS THORACIC INLET/BODY WALL:  6.4 x 2 x 5 cm collection in the left chest which has mottled lucency. This is presumably a packed wound given the lucency extends through the skin incision. No focal fluid collection within this pocket to suggest loculation. There is a right upper extremity PICC, tip at the SVC level.  MEDIASTINUM:  Normal heart size. No pericardial effusion. Atherosclerosis, including the coronary arteries. Moderate sliding-type hiatal hernia. No evidence of acute vascular abnormality. No adenopathy.  LUNG WINDOWS:  There is complete opacification of the collapsed left lung. High-density material present within the left mainstem bronchus. This was not seen on the comparison study. There is a moderate left pleural effusion. Small right pleural effusion. Patchy airspace opacity in the right upper lung.  UPPER ABDOMEN:  No acute findings.  OSSEOUS:  Remote T12 compression fracture.  Critical Value/emergent results were called by telephone at the time of interpretation on 02-07-2014 at 3:45 PM to Dr. Fayrene HelperBOWIE TRAN , who verbally acknowledged these results.  IMPRESSION 1. Occluded left mainstem bronchus (high density of this material suggests aspiration over mucoid impaction) with complete collapse of the left lung. 2. Patchy airspace disease in the right upper lobe. The distribution is not typical for aspiration while recumbent, and may represent pneumonia. 3. Packed pacer pocket in the left chest.  SIGNATURE  Electronically Signed   By: Tiburcio PeaJonathan  Watts M.D.   On: 002-13-2015 15:47   Dg Chest Port 1 View  02-07-2014   CLINICAL DATA Low pulse ox, shortness of breath   EXAM PORTABLE CHEST - 1 VIEW  COMPARISON DG CHEST 1V PORT dated 03/01/2014  FINDINGS There is a right-sided PICC line with the tip projecting over the SVC. There is interval left lung volume loss and complete white out likely reflecting a combination of atelectatic lung and pleural fluid. There is chronic right lung interstitial thickening. There is no right lung focal parenchymal opacity.  There is no pneumothorax. Stable cardiomediastinal silhouette.  The osseous structures are unremarkable.  IMPRESSION Interval left lung volume loss and complete white out likely reflecting a combination of atelectatic lung and pleural fluid.  SIGNATURE  Electronically Signed   By: Elige Ko   On: 03/06/2014 12:48    EKG: Independently reviewed.   Assessment/Plan Active Problems:   HCAP (healthcare-associated pneumonia)   Stroke, lacunar-identified by CT scanning   HTN (hypertension)   Chronic diastolic heart failure   Pacemaker-Medtronic   Pacemaker infection   Renal insufficiency   Bacteremia   Pneumonia   1. Healthcare associated pneumonia. Patient recently hospitalized for pacer pocket infection, discharged on IV vancomycin, presenting today to her primary care physician's office with complaints of shortness of breath. CT scan of lungs performed in the emergency room showing an occluded left main stem bronchus associated with complete collapse of the left lung as well as patchy airspace disease in the right upper lobe. Will continue IV cefepime along with vancomycin. Followup on blood cultures, sputum cultures, lactate level provide supportive care, admit to step down unit for close monitoring overnight. Followup recommendations from pulmonary critical care medicine, may need bronchoscopy. 2. Question aspiration. Radiology reporting high density material and left mainstem bronchus to suggest aspiration. Will consult speech pathology for swallowing evaluation. 3. History of pacemaker infection, recently  undergoing device extraction on 02/25/2014, with a planned two-week course of vancomycin. Subsequent blood cultures were negative. Will continue IV vancomycin.  4. Chronic diastolic congestive heart failure. Most recent transthoracic echocardiogram was performed on 02/25/2014 which showed an ejection fraction of 55-60% with grade 2 diastolic dysfunction. She appears compensated.  5. Paroxysmal atrial fibrillation. Continue amiodarone therapy. Presently rate controlled. 6. Chronic anticoagulation. Consult pharmacy for Coumadin management. She presents with a subtherapeutic INR of 1.5 and PT of 17.7. 7. Hypokalemia. Lab work showing a potassium of 3.2, will provide K. Dur    Code Status: I discussed CODE STATUS with patient and her daughter present at bedside, she wishes to be a DO NOT RESUSCITATE as she would not want to undergo CPR or be maintained on life support  Family Communication: I spoke with patient's daughter present at bedside Disposition Plan: Will admit patient to the inpatient service, I anticipate she'll require greater than 2 night hospitalization  Time spent: 70 min  Jeralyn Bennett [facility] Pager [phone]

## 2014-03-05 NOTE — ED Provider Notes (Signed)
CSN: [id number]     Arrival date & time 03/25/2014  1203 History   First MD Initiated Contact with Patient 03/15/2014 1210     Chief Complaint  Patient presents with  . Shortness of Breath     (Consider location/radiation/quality/duration/timing/severity/associated sxs/prior Treatment) HPI  80 year old female with history of atrial fibrillation currently on warfarin, CHF, renal insufficiency, recently infected pacemaker causing bacteremia which was recently removed this past month who presents complaining of shortness of breath. History limited as patient is not a reliable historian. Patient was sent here from her PCP office after a followup visit today. Patient has been complaining of difficulty breathing both while resting and with walking. She reports having increased abdominal swelling. She was found to have sats of 76% on room air while ambulating.  She has also complaining of vomiting daily for the past 5 days. She has infection with gram positive cocci in clusters at pacer site requiring removal of the Medtronic device. She has been on vancomycin. Patient does complain of intermittent shortness of breath but unable to describe much. She however denies fever chills, headache, chest pain, abdominal pain, numbness or weakness. She is not on home O2. She cannot tell me if she has resumed taking Coumadin. Level V caveat applies.   Past Medical History  Diagnosis Date  . Atrial fibrillation     a. Dx 12/2011. b. Adm at [facility] 2014 placed on sotalol but had bradycardia with this. c. Adm 06/2013 with AF-RVR - due to h/o bradycardia, underwent Medtronic ppm & amiodarone initiation (spont conv to NSR).   [name]. GERD (gastroesophageal reflux disease)   . Stroke, lacunar-identified by CT scanning 01/01/2012  . CHF (congestive heart failure)     a. [facility] ~06/2013.  [name]. Pneumonia 06/2013  . Renal insufficiency     a. noted by labs 06/2013.  . Bacteremia 02/23/2014    gram positive  cocci in clusters  . Chronotropic incompetence with sinus node dysfunction 10/18/2013   Past Surgical History  Procedure Laterality Date  . Tonsillectomy  ~ 1950  . Cataract extraction w/ intraocular lens implant Left 07/08/2013  . Pacemaker insertion  07-10-2013    MDT ADDRL1 pacemaker implanted by Dr Graciela HusbandsKlein for tachy-brady syndrome  . Pacemaker removal  02/25/14    Pacemaker system removed by Dr Johney FrameAllred for bacteremia  . Tee without cardioversion N/A 02/26/2014    Procedure: TRANSESOPHAGEAL ECHOCARDIOGRAM (TEE);  Surgeon: Chrystie NoseKenneth C. Hilty, MD;  [facility];  Service: Cardiovascular;  Laterality: N/A;   No family history on file. History  Substance Use Topics  . Smoking status: Never Smoker   . Smokeless tobacco: Never Used  . Alcohol Use: No   OB History   Grav Para Term Preterm Abortions TAB SAB Ect Mult Living                 Review of Systems  Unable to perform ROS: Age      Allergies  Levaquin  Home Medications   Current Outpatient Rx  Name  Route  Sig  Dispense  Refill  . amiodarone (PACERONE) 200 MG tablet   Oral   Take 1 tablet (200 mg total) by mouth daily.   90 tablet   2   . atorvastatin (LIPITOR) 20 MG tablet   Oral   Take 10 mg by mouth at bedtime.    30 tablet   11   . ferrous gluconate (FERGON) 325 MG tablet   Oral  Take 325 mg by mouth daily with breakfast.         . furosemide (LASIX) 40 MG tablet      20 mg daily.         [name] lisinopril (PRINIVIL,ZESTRIL) 10 MG tablet      10 mg daily.         . Vancomycin (VANCOCIN) 750 MG/150ML SOLN   Intravenous   Inject 150 mLs (750 mg total) into the vein daily.   150 mL   3   . vitamin B-12 (CYANOCOBALAMIN) 500 MCG tablet   Oral   Take 500 mcg by mouth daily.         [name] warfarin (COUMADIN) 2 MG tablet   Oral   Take 2 mg by mouth daily.          BP 142/53  Pulse 76  Temp(Src) 97.2 F (36.2 C) (Oral)  Resp 20  SpO2 92% Physical Exam  Nursing note and vitals  reviewed. Constitutional: She appears well-developed and well-nourished. No distress.  HENT:  Head: Atraumatic.  Poor dentition  Eyes: Conjunctivae and EOM are normal. Pupils are equal, round, and reactive to light.  Neck: Normal range of motion. Neck supple. No JVD present.  Cardiovascular: Normal rate and regular rhythm.  Exam reveals no gallop and no friction rub.   No murmur heard. Pulmonary/Chest: Effort normal. No respiratory distress. She has no wheezes. She has no rales.  L upper chest: area of mild induration and erythema at surgical site without any fluctuance or tenderness to palpation.  No active discharge.    Normal lung sounds to R lung, decreased lung sounds to L lung.  No obvious rales/rhonchi/wheezes  Abdominal: Soft. There is no tenderness.  Musculoskeletal: She exhibits no edema.  Neurological: She is alert.  Alert to self, able to answer basic question, unable to recall place, or time.    Skin: No rash noted.  Psychiatric: She has a normal mood and affect.    ED Course  Procedures (including critical care time)  12:40 PM Pt with hx of afib currently on coumadin who presents with SOB and hypoxia with ambulation at doctors' office today.  Sats drops down to 78% on RA with ambulation.  She is currently sats at 96% on 3L O2, in no acute resp distress however decreased lung sounds to L lung.  Work up initiated.   1:26 PM Pt has elevated WBC 15.3, normal lactic acid.  ProBNP 2930, INR 1.5, CXR demonstrates interval L lung volume loss and complete whiteout likely reflecting a commination of atelectatic lung and pleural fluid.  Family member report pt has been coughing since discharged from hospital past Saturday.  Will add Cefepime abx, blood cultures draws and will consult for admission.  Care discussed with Dr. Loretha Stapler.    1:59 PM I have consulted [facility], Dr. Jerral Ralph who recommend obtain chest CT to evaluate for either pleural effusion vs. Mucous plug.  CT  ordered.    4:28 PM Chest CT demonstrate occluded L mainstem bronchus with complete collapse of the L lung.  Patchy airspace disease in the RUL.  I discussed this finding with hospitalist who request pulmonology to admit pt as pt may require bronchoscopy.  I have consulted with pulmonology specialist, who agrees to see and admit pt for further care.    4:50 PM Pulmonology specialist has seen pt and felt she does not need emergent bronchoscopy.  The request [facility] for admission and they will be available for consultation.  Pt will need to be treated for pna and pulmonary toilet.  Pt current sats at 94% on 3L O2, appears comfortable.    5:10 PM I have consulted with [facility], Dr. Joseph Art who agrees to admit pt to team 10, step down, under his care.    Labs Review Labs Reviewed  CBC WITH DIFFERENTIAL - Abnormal; Notable for the following:    WBC 15.3 (*)    RBC 3.40 (*)    Hemoglobin 10.8 (*)    HCT 31.2 (*)    Neutrophils Relative % 83 (*)    Neutro Abs 12.6 (*)    Lymphocytes Relative 5 (*)    Monocytes Absolute 1.9 (*)    All other components within normal limits  BASIC METABOLIC PANEL - Abnormal; Notable for the following:    Sodium 136 (*)    Potassium 3.2 (*)    Chloride 95 (*)    Glucose, Bld 121 (*)    BUN 28 (*)    Creatinine, Ser 1.35 (*)    GFR calc non Af Amer 36 (*)    GFR calc Af Amer 41 (*)    All other components within normal limits  PRO B NATRIURETIC PEPTIDE - Abnormal; Notable for the following:    Pro B Natriuretic peptide (BNP) 2930.0 (*)    All other components within normal limits  PROTIME-INR - Abnormal; Notable for the following:    Prothrombin Time 17.7 (*)    INR 1.50 (*)    All other components within normal limits  CULTURE, BLOOD (ROUTINE X 2)  CULTURE, BLOOD (ROUTINE X 2)  VANCOMYCIN, RANDOM  I-STAT TROPOININ, ED  I-STAT CG4 LACTIC ACID, ED   Imaging Review Ct Chest Wo Contrast  03/25/2014   CLINICAL DATA Shortness of breath.  Recent  pacemaker removal for infection  EXAM CT CHEST WITHOUT CONTRAST  TECHNIQUE Multidetector CT imaging of the chest was performed following the standard protocol without IV contrast.  COMPARISON 06/26/2013  FINDINGS THORACIC INLET/BODY WALL:  6.4 x 2 x 5 cm collection in the left chest which has mottled lucency. This is presumably a packed wound given the lucency extends through the skin incision. No focal fluid collection within this pocket to suggest loculation. There is a right upper extremity PICC, tip at the SVC level.  MEDIASTINUM:  Normal heart size. No pericardial effusion. Atherosclerosis, including the coronary arteries. Moderate sliding-type hiatal hernia. No evidence of acute vascular abnormality. No adenopathy.  LUNG WINDOWS:  There is complete opacification of the collapsed left lung. High-density material present within the left mainstem bronchus. This was not seen on the comparison study. There is a moderate left pleural effusion. Small right pleural effusion. Patchy airspace opacity in the right upper lung.  UPPER ABDOMEN:  No acute findings.  OSSEOUS:  Remote T12 compression fracture.  Critical Value/emergent results were called by telephone at the time of interpretation on 03/10/2014 at 3:45 PM to Dr. Fayrene Helper , who verbally acknowledged these results.  IMPRESSION 1. Occluded left mainstem bronchus (high density of this material suggests aspiration over mucoid impaction) with complete collapse of the left lung. 2. Patchy airspace disease in the right upper lobe. The distribution is not typical for aspiration while recumbent, and may represent pneumonia. 3. Packed pacer pocket in the left chest.  SIGNATURE  Electronically Signed   By: Tiburcio Pea M.D.   On: 03/21/2014 15:47   Dg Chest Port 1 View  02/23/2014   CLINICAL DATA Low pulse ox, shortness of  breath  EXAM PORTABLE CHEST - 1 VIEW  COMPARISON DG CHEST 1V PORT dated 03/01/2014  FINDINGS There is a right-sided PICC line with the tip  projecting over the SVC. There is interval left lung volume loss and complete white out likely reflecting a combination of atelectatic lung and pleural fluid. There is chronic right lung interstitial thickening. There is no right lung focal parenchymal opacity. There is no pneumothorax. Stable cardiomediastinal silhouette.  The osseous structures are unremarkable.  IMPRESSION Interval left lung volume loss and complete white out likely reflecting a combination of atelectatic lung and pleural fluid.  SIGNATURE  Electronically Signed   By: Elige Ko   On: 2014-03-13 12:48     EKG Interpretation None      Date: 13-Mar-2014  Rate: 76  Rhythm: accelerated junctional rhythm  QRS Axis: normal  Intervals: QT prolonged  ST/T Wave abnormalities: nonspecific T wave changes  Conduction Disutrbances:none  Narrative Interpretation:   Old EKG Reviewed: unchanged    MDM   Final diagnoses:  SOB (shortness of breath)  HCAP (healthcare-associated pneumonia)  Pleural effusion on left    BP 127/49  Pulse 74  Temp(Src) 97.2 F (36.2 C) (Oral)  Resp 13  Ht 4' 11.06" (1.5 m)  Wt 115 lb 11.9 oz (52.5 kg)  BMI 23.33 kg/m2  SpO2 100%  I have reviewed nursing notes and vital signs. I personally reviewed the imaging tests through PACS system  I reviewed available ER/hospitalization records thought the EMR     Fayrene Helper, PA-C 03/13/2014 1648  Fayrene Helper, PA-C 2014-03-13 1711

## 2014-03-05 NOTE — ED Notes (Signed)
[facility] Charity fundraiserN. Patient okay for transfer to [facility].

## 2014-03-06 ENCOUNTER — Ambulatory Visit: Payer: Commercial Managed Care - HMO

## 2014-03-06 ENCOUNTER — Inpatient Hospital Stay (HOSPITAL_COMMUNITY): Payer: Medicare HMO

## 2014-03-06 LAB — CBC WITH DIFFERENTIAL/PLATELET
BASOS ABS: 0 10*3/uL (ref 0.0–0.1)
BASOS PCT: 0 % (ref 0–1)
Eosinophils Absolute: 0 10*3/uL (ref 0.0–0.7)
Eosinophils Relative: 0 % (ref 0–5)
HCT: 28.5 % — ABNORMAL LOW (ref 36.0–46.0)
Hemoglobin: 9.8 g/dL — ABNORMAL LOW (ref 12.0–15.0)
Lymphocytes Relative: 6 % — ABNORMAL LOW (ref 12–46)
Lymphs Abs: 0.7 10*3/uL (ref 0.7–4.0)
MCH: 31.6 pg (ref 26.0–34.0)
MCHC: 34.4 g/dL (ref 30.0–36.0)
MCV: 91.9 fL (ref 78.0–100.0)
Monocytes Absolute: 1.7 10*3/uL — ABNORMAL HIGH (ref 0.1–1.0)
Monocytes Relative: 14 % — ABNORMAL HIGH (ref 3–12)
Neutro Abs: 9.9 10*3/uL — ABNORMAL HIGH (ref 1.7–7.7)
Neutrophils Relative %: 80 % — ABNORMAL HIGH (ref 43–77)
PLATELETS: 244 10*3/uL (ref 150–400)
RBC: 3.1 MIL/uL — ABNORMAL LOW (ref 3.87–5.11)
RDW: 15.4 % (ref 11.5–15.5)
WBC: 12.3 10*3/uL — ABNORMAL HIGH (ref 4.0–10.5)

## 2014-03-06 LAB — COMPREHENSIVE METABOLIC PANEL
ALT: 119 U/L — AB (ref 0–35)
AST: 103 U/L — ABNORMAL HIGH (ref 0–37)
Albumin: 2.3 g/dL — ABNORMAL LOW (ref 3.5–5.2)
Alkaline Phosphatase: 74 U/L (ref 39–117)
BUN: 24 mg/dL — AB (ref 6–23)
CALCIUM: 8 mg/dL — AB (ref 8.4–10.5)
CO2: 24 meq/L (ref 19–32)
Chloride: 101 mEq/L (ref 96–112)
Creatinine, Ser: 1.24 mg/dL — ABNORMAL HIGH (ref 0.50–1.10)
GFR calc Af Amer: 46 mL/min — ABNORMAL LOW (ref >90)
GFR, EST NON AFRICAN AMERICAN: 40 mL/min — AB (ref >90)
Glucose, Bld: 109 mg/dL — ABNORMAL HIGH (ref 70–99)
Potassium: 4.5 mEq/L (ref 3.7–5.3)
SODIUM: 140 meq/L (ref 137–147)
Total Bilirubin: 0.5 mg/dL (ref 0.3–1.2)
Total Protein: 6.3 g/dL (ref 6.0–8.3)

## 2014-03-06 LAB — GLUCOSE, CAPILLARY
GLUCOSE-CAPILLARY: 146 mg/dL — AB (ref 70–99)
GLUCOSE-CAPILLARY: 165 mg/dL — AB (ref 70–99)
Glucose-Capillary: 120 mg/dL — ABNORMAL HIGH (ref 70–99)

## 2014-03-06 LAB — GRAM STAIN

## 2014-03-06 LAB — PROCALCITONIN: PROCALCITONIN: 0.17 ng/mL

## 2014-03-06 LAB — HIV ANTIBODY (ROUTINE TESTING W REFLEX): HIV: NONREACTIVE

## 2014-03-06 LAB — MAGNESIUM: Magnesium: 1.8 mg/dL (ref 1.5–2.5)

## 2014-03-06 LAB — PROTIME-INR
INR: 1.64 — ABNORMAL HIGH (ref 0.00–1.49)
Prothrombin Time: 19 seconds — ABNORMAL HIGH (ref 11.6–15.2)

## 2014-03-06 LAB — TSH: TSH: 2.459 u[IU]/mL (ref 0.350–4.500)

## 2014-03-06 LAB — MRSA PCR SCREENING: MRSA by PCR: NEGATIVE

## 2014-03-06 MED ORDER — SODIUM CHLORIDE 0.9 % IV BOLUS (SEPSIS)
500.0000 mL | Freq: Once | INTRAVENOUS | Status: AC
  Administered 2014-03-06: 500 mL via INTRAVENOUS

## 2014-03-06 MED ORDER — WARFARIN SODIUM 4 MG PO TABS
4.0000 mg | ORAL_TABLET | Freq: Once | ORAL | Status: DC
  Filled 2014-03-06: qty 1

## 2014-03-06 MED ORDER — SODIUM CHLORIDE 0.9 % IV SOLN
1.0000 mg/h | INTRAVENOUS | Status: DC
  Administered 2014-03-06: 2 mg/h via INTRAVENOUS
  Filled 2014-03-06: qty 10

## 2014-03-06 MED ORDER — PHENYLEPHRINE HCL 10 MG/ML IJ SOLN
30.0000 ug/min | INTRAMUSCULAR | Status: DC
  Administered 2014-03-06 (×2): 50 ug/min via INTRAVENOUS
  Filled 2014-03-06 (×2): qty 1

## 2014-03-06 MED ORDER — PHENYLEPHRINE HCL 10 MG/ML IJ SOLN
30.0000 ug/min | INTRAVENOUS | Status: DC
  Administered 2014-03-06: 60 ug/min via INTRAVENOUS
  Administered 2014-03-07: 200 ug/min via INTRAVENOUS
  Administered 2014-03-07: 160 ug/min via INTRAVENOUS
  Administered 2014-03-07: 200 ug/min via INTRAVENOUS
  Administered 2014-03-07: 160 ug/min via INTRAVENOUS
  Administered 2014-03-07: 200 ug/min via INTRAVENOUS
  Administered 2014-03-07: 60 ug/min via INTRAVENOUS
  Administered 2014-03-08 (×4): 200 ug/min via INTRAVENOUS
  Filled 2014-03-06 (×10): qty 4

## 2014-03-06 MED ORDER — BIOTENE DRY MOUTH MT LIQD
15.0000 mL | Freq: Four times a day (QID) | OROMUCOSAL | Status: DC
  Administered 2014-03-07 – 2014-03-08 (×8): 15 mL via OROMUCOSAL

## 2014-03-06 MED ORDER — ETOMIDATE 2 MG/ML IV SOLN
20.0000 mg | Freq: Once | INTRAVENOUS | Status: AC
  Administered 2014-03-06: 20 mg via INTRAVENOUS

## 2014-03-06 MED ORDER — FENTANYL CITRATE 0.05 MG/ML IJ SOLN
100.0000 ug | Freq: Once | INTRAMUSCULAR | Status: AC
  Administered 2014-03-06: 100 ug via INTRAVENOUS

## 2014-03-06 MED ORDER — SODIUM CHLORIDE 0.9 % IV SOLN
25.0000 ug/h | INTRAVENOUS | Status: DC
  Administered 2014-03-06: 100 ug/h via INTRAVENOUS
  Filled 2014-03-06 (×2): qty 50

## 2014-03-06 MED ORDER — CHLORHEXIDINE GLUCONATE 0.12 % MT SOLN
15.0000 mL | Freq: Two times a day (BID) | OROMUCOSAL | Status: DC
  Administered 2014-03-06 – 2014-03-08 (×4): 15 mL via OROMUCOSAL
  Filled 2014-03-06 (×4): qty 15

## 2014-03-06 MED ORDER — PANTOPRAZOLE SODIUM 40 MG IV SOLR
40.0000 mg | INTRAVENOUS | Status: DC
  Administered 2014-03-06 – 2014-03-08 (×3): 40 mg via INTRAVENOUS
  Filled 2014-03-06 (×3): qty 40

## 2014-03-06 MED ORDER — MIDAZOLAM HCL 2 MG/2ML IJ SOLN
2.0000 mg | Freq: Once | INTRAMUSCULAR | Status: AC
  Administered 2014-03-06: 2 mg via INTRAVENOUS

## 2014-03-06 NOTE — Procedures (Signed)
Name:  Buford DresserBetty Schleyer MRN:  [id number] DOB:  10-Jun-1930  PROCEDURE NOTE  Procedure:   Flexible bronchoscopy ([id number])  Indications:  Left lung collapse  Consent:  Procedure, benefits, risks and alternatives discussed.  Questions answered.  Consent obtained.  Anesthesia:  Already sedated for mechanical ventilation.   Procedure summary:  Appropriate equipment was assembled.  The patient was identified as Buford DresserBetty Mcglown.  Safety timeout was performed. The patient was placed supine and adequate level of sedation was assured.  Flexible bronchoscope was lubricated and inserted via the endotracheal tube.  Airway examination was performed bilaterally to subsegmental level.  Right lung airway appeared normal.  Distal left mainstem bronchus, secondary carina and lobar airway were coated and near completely obstructed with white cement - like material.  I could not pass the scope past secondary carina without causing significant oozing.  Washings were performed and after hemostasis was assured, the bronchoscope was withdrawn.  Specimens sent: Bronchial washings for culture and cytology.  Images were taken and can be found in Epic under Chart Review >>> Media.  Complications:  No immediate complications were noted.  Hemodynamic parameters and oxygenation remained stable throughout the procedure.  Estimated blood loss:  Less then 5 mL.  Orlean BradfordK. Deysha Cartier, M.D. Pulmonary and Critical Care Medicine [facility] Pager: [phone]  03/06/2014, 4:03 PM

## 2014-03-06 NOTE — Progress Notes (Signed)
Utilization Review Completed.  

## 2014-03-06 NOTE — ED Provider Notes (Signed)
Medical screening examination/treatment/procedure(s) were conducted as a shared visit with non-physician practitioner(s) and myself.  I personally evaluated the patient during the encounter.   EKG Interpretation None        [name] ChurnJohn David Chaka Jefferys III, MD 03/06/14 [phone]

## 2014-03-06 NOTE — Progress Notes (Signed)
ANTICOAGULATION CONSULT NOTE - Follow Up Consult  Pharmacy Consult for warfarin Indication: atrial fibrillation  Allergies  Allergen Reactions  . Levaquin [Levofloxacin In D5w] Nausea And Vomiting    Patient Measurements: Height: 4' 11.06" (150 cm) Weight: 115 lb 11.9 oz (52.5 kg) IBW/kg (Calculated) : 43.33  Vital Signs: Temp: 97.8 F (36.6 C) (03/12 0730) Temp src: Oral (03/12 0407) BP: 114/33 mmHg (03/12 0407) Pulse Rate: 67 (03/12 0407)  Labs:  Recent Labs  03/14/2014 1226 03/06/14 0545  HGB 10.8* 9.8*  HCT 31.2* 28.5*  PLT 248 244  LABPROT 17.7* 19.0*  INR 1.50* 1.64*  CREATININE 1.35* 1.24*    Estimated Creatinine Clearance: 26.4 ml/min (by C-G formula based on Cr of 1.24).  Assessment: [age] yof continues on coumadin for afib. INR remains low at 1.64. CBC is stable and no bleeding noted. INR likely low related to recent skipped dose. Will give another boosted dose tonight with hopes of resuming her home regimen soon.   Goal of Therapy:  INR 2-3   Plan:  1. Repeat warfarin 4mg  PO x 1 tonight 2. F/u AM INR  Reed Dady, Drake Leachachel Lynn 03/06/2014,9:04 AM

## 2014-03-06 NOTE — Progress Notes (Signed)
[facility]  Patient Status: Active (receiving services up to time of hospitalization)  [facility] is providing the following services: RN and Home Infusion Services (teaching and education will be done by nurse in the home with patient and caregiver)  If patient discharges after hours, please call [phone].   Kizzie FurnishDonna Fellmy 03/06/2014, 10:19 AM

## 2014-03-06 NOTE — Procedures (Signed)
Bedside Bronchoscopy Procedure Note [name] DresserBetty Daniels [id number] 23-Nov-1930  Procedure: Bronchoscopy Indications: Obtain specimens for culture and/or other diagnostic studies and Remove secretions  Procedure Details: ET Tube Size:7.5 ET Tube secured at lip (cm):20 Bite block in place: No In preparation for procedure, Patient hyper-oxygenated with 100 % FiO2 Airway entered and the following bronchi were examined: RUL, RML, RLL, LUL and LLL.   Bronchoscope removed.    Evaluation BP 77/23  Pulse 57  Temp(Src) 98 F (36.7 C) (Oral)  Resp 17  Ht 4' 11.06" (1.5 m)  Wt 115 lb 11.9 oz (52.5 kg)  BMI 23.33 kg/m2  SpO2 98% Breath Sounds:Clear and Diminished O2 sats: stable throughout Patient's Current Condition: stable Specimens:  Complications: No apparent complications Patient did tolerate procedure well.   Cherylin MylarDoyle, Suella Cogar 03/06/2014, 3:29 PM

## 2014-03-06 NOTE — Procedures (Signed)
Name: [name] DresserBetty Daniels MRN: [id number] DOB: [birth year]   PROCEDURE NOTE  Procedure:  Endotracheal intubation.  Indication:  Acute respiratory failure  Consent:  Consent was implied due to the emergency nature of the procedure.  Anesthesia:  A total of 10 mg of Etomidate was given intravenously.  Procedure summary:  Appropriate equipment was assembled. The patient was identified as [name] DresserBetty Middleton and safety timeout was performed. The patient was placed supine, with head in sniffing position. After adequate level of anesthesia was achieved, a GS#3 blade was inserted into the oropharynx and the vocal cords were visualized. A 7.5 endotracheal tube was inserted without difficulty and visualized going through the vocal cords. The stylette was removed and cuff inflated. Colorimetric change was noted on the CO2 meter. Breath sounds were heard over both lung fields equally. ETT was secured at 22 lip line.  Post procedure chest xray was ordered.  Complications:  No immediate complications were noted.  Hemodynamic parameters and oxygenation remained stable throughout the procedure.    Orlean BradfordK. Deronda Christian, M.D. Pulmonary and Critical Care Medicine [facility] Pager: [phone]  03/06/2014, 4:01 PM

## 2014-03-06 NOTE — Evaluation (Signed)
Clinical/Bedside Swallow Evaluation Patient Details  Name: [name] Daniels MRN: [id number] Date of Birth: 1930/12/08  Today's Date: 03/06/2014 Time: 8295-6213 SLP Time Calculation (min): 31 min  Past Medical History:  Past Medical History  Diagnosis Date  . Atrial fibrillation     a. Dx 12/2011. b. Adm at [facility] 2014 placed on sotalol but had bradycardia with this. c. Adm 06/2013 with AF-RVR - due to h/o bradycardia, underwent Medtronic ppm & amiodarone initiation (spont conv to NSR).   [name] GERD (gastroesophageal reflux disease)   . Stroke, lacunar-identified by CT scanning 01/01/2012  . CHF (congestive heart failure)     a. Admission for HFpEF at [facility] ~06/2013.  [name] Pneumonia 06/2013  . Renal insufficiency     a. noted by labs 06/2013.  . Bacteremia 02/23/2014    gram positive cocci in clusters  . Chronotropic incompetence with sinus node dysfunction 10/18/2013   Past Surgical History:  Past Surgical History  Procedure Laterality Date  . Tonsillectomy  ~ 1950  . Cataract extraction w/ intraocular lens implant Left 07/08/2013  . Pacemaker insertion  07-10-2013    MDT ADDRL1 pacemaker implanted by Dr [name] for tachy-brady syndrome  . Pacemaker removal  02/25/14    Pacemaker system removed by Dr Johney Frame for bacteremia  . Tee without cardioversion N/A 02/26/2014    Procedure: TRANSESOPHAGEAL ECHOCARDIOGRAM (TEE);  Surgeon: Chrystie Nose, MD;  Location: [facility] ENDOSCOPY;  Service: Cardiovascular;  Laterality: N/A;   HPI:  [name] Daniels is an 80 year old female with history of atrial fibrillation currently on warfarin, CHF, renal insufficiency, recently infected pacemaker causing bacteremia which was recently removed this past month who presents complaining of shortness of breath. Medtronic device was removed secondary to infxn with gram positive cocci in clusters. Patient was sent here from her PCP office after a followup visit today. Patient has been complaining of difficulty breathing both while  resting and with walking. She reports having increased abdominal swelling. She was found to have sats of 76% on room air while ambulating. She has also complaining of vomiting daily for the past 5 days. Chest imaging demonstrated L lung volume loss with L mainstem bronchus cut off sign and associated pleural effusion. MD suspects mucous plug with HCAP or aspiration pna due to emesis. Pt also had DG esophagus on 3/5 due to inability to pass TEE probe. No stricture or mass, mild intermittent spasm of distal esophagus, no reflux.   Assessment / Plan / Recommendation Clinical Impression  Pt demonstrates difficult subjective eval due to baseline cough. Pt coughs before and after PO, though cough is not immediate and not forceful and is seen with both solids and liquids. This indicates that is pt were aspirating it would be initally silent with limited response. Pt had an esophagram one week ago with no mention of aspiration and only mild esophageal dysmotility.  It is unlikely that pt would have a sudden onset of severe dysphagia in one week unless pt had a CVA. Based on eval would not expect dysphagia to be high on the differential dx list. Recommend a regular diet and thin liquids if MD in agreement. If aspiration highly suspected, order MBS.  Will f/u x1 for check of tolerance.     Aspiration Risk  Mild    Diet Recommendation Regular;Thin liquid   Liquid Administration via: Cup;Straw Medication Administration: Whole meds with liquid Supervision: Patient able to self feed Postural Changes and/or Swallow Maneuvers: Seated upright 90 degrees;Upright 30-60 min after meal  Other  Recommendations Oral Care Recommendations: Oral care BID   Follow Up Recommendations  None    Frequency and Duration min 2x/week  1 week   Pertinent Vitals/Pain NA    SLP Swallow Goals     Swallow Study Prior Functional Status       General HPI: [name] Daniels is an 80 year old female with history of atrial  fibrillation currently on warfarin, CHF, renal insufficiency, recently infected pacemaker causing bacteremia which was recently removed this past month who presents complaining of shortness of breath. Medtronic device was removed secondary to infxn with gram positive cocci in clusters. Patient was sent here from her PCP office after a followup visit today. Patient has been complaining of difficulty breathing both while resting and with walking. She reports having increased abdominal swelling. She was found to have sats of 76% on room air while ambulating. She has also complaining of vomiting daily for the past 5 days. Chest imaging demonstrated L lung volume loss with L mainstem bronchus cut off sign and associated pleural effusion. MD suspects mucous plug with HCAP or aspiration pna due to emesis. Pt also had DG esophagus on 3/5 due to inability to pass TEE probe. No stricture or mass, mild intermittent spasm of distal esophagus, no reflux. Type of Study: Bedside swallow evaluation Previous Swallow Assessment: Esophagram 3/5 Diet Prior to this Study: NPO Temperature Spikes Noted: No Respiratory Status: Nasal cannula History of Recent Intubation: No Behavior/Cognition: Alert;Cooperative;Pleasant mood Oral Cavity - Dentition: Adequate natural dentition Self-Feeding Abilities: Able to feed self Patient Positioning: Upright in bed Baseline Vocal Quality: Clear Volitional Cough: Congested Volitional Swallow: Able to elicit    Oral/Motor/Sensory Function Overall Oral Motor/Sensory Function: Appears within functional limits for tasks assessed   Ice Chips     Thin Liquid Thin Liquid: Impaired Presentation: Cup;Straw;Self Fed Pharyngeal  Phase Impairments: Cough - Delayed    Nectar Thick Nectar Thick Liquid: Not tested   Honey Thick Honey Thick Liquid: Not tested   Puree Puree: Impaired Presentation: Spoon Oral Phase Functional Implications: Prolonged oral transit Pharyngeal Phase Impairments:  Cough - Delayed   Solid   GO    Solid: Impaired Presentation: Self Fed Pharyngeal Phase Impairments: Cough - Delayed      Harlon DittyBonnie Tayveon Lombardo, MA CCC-SLP [phone]  Chigozie Basaldua, Riley NearingBonnie Caroline 03/06/2014,10:23 AM

## 2014-03-06 NOTE — Consult Note (Signed)
PULMONARY / CRITICAL CARE MEDICINE  Name: [name] Daniels MRN: [id number] DOB: October 09, 1930    ADMISSION DATE:  02/23/2014 CONSULTATION DATE:  02/26/2014  REFERRING MD :  EDP PRIMARY SERVICE:  TRH  CHIEF COMPLAINT:  SOB  BRIEF PATIENT DESCRIPTION: 80 yo with recent hospitalization for bacteremia secondary to infected pacemaker, sent to ED from PCP office for dyspnea and hypoxia.  Chest imaging demonstrated L lung volume loss with L mainstem bronchus cut off sign and associated pleural effusion.  SIGNIFICANT EVENTS / STUDIES:  3/11  CT Chest >>> Occluded left mainstem bronchus with complete collapse of left lung and associated effusion 3/12  SLP eval >>> 3/12  FOB >>>  Dysphagia unlikely  LINES / TUBES:  CULTURES: 3/11 Blood >>>  ANTIBIOTICS: Cefepime 3/11 >>> Vancomycin 3/11 >>>  INTERVAL HISTORY:  No significant improvement in symptoms / imaging overnight.  Discussed possible need for bronchoscopy / and intubation with patient / daughter.  They do not oppose intubation for potentially reversible diease.  VITAL SIGNS: Temp:  [97.8 F (36.6 C)-98 F (36.7 C)] 97.8 F (36.6 C) (03/12 0730) Pulse Rate:  [67-80] 78 (03/12 0800) Resp:  [13-34] 34 (03/12 0800) BP: (95-160)/(29-83) 158/36 mmHg (03/12 0800) SpO2:  [90 %-100 %] 91 % (03/12 0800) Weight:  [52.5 kg (115 lb 11.9 oz)] 52.5 kg (115 lb 11.9 oz) (03/11 1300)  PHYSICAL EXAMINATION: General: No acute distress Neuro: awake and alert HEENT: [place]/AT. PERRL Cardiovascular: Irregular, no murmurs Lungs: Air movement diminished over L lung field, few rhonchi, weak cough Abdomen: soft, non tender Musculoskeletal: No edema.  Skin: No rash   Recent Labs Lab 03/04/2014 1226 03/06/14 0545  NA 136* 140  K 3.2* 4.5  CL 95* 101  CO2 23 24  BUN 28* 24*  CREATININE 1.35* 1.24*  GLUCOSE 121* 109*    Recent Labs Lab 03/03/2014 1226 03/06/14 0545  HGB 10.8* 9.8*  HCT 31.2* 28.5*  WBC 15.3* 12.3*  PLT 248 244   Ct Chest Wo  Contrast  03/02/2014   CLINICAL DATA Shortness of breath.  Recent pacemaker removal for infection  EXAM CT CHEST WITHOUT CONTRAST  TECHNIQUE Multidetector CT imaging of the chest was performed following the standard protocol without IV contrast.  COMPARISON 06/26/2013  FINDINGS THORACIC INLET/BODY WALL:  6.4 x 2 x 5 cm collection in the left chest which has mottled lucency. This is presumably a packed wound given the lucency extends through the skin incision. No focal fluid collection within this pocket to suggest loculation. There is a right upper extremity PICC, tip at the SVC level.  MEDIASTINUM:  Normal heart size. No pericardial effusion. Atherosclerosis, including the coronary arteries. Moderate sliding-type hiatal hernia. No evidence of acute vascular abnormality. No adenopathy.  LUNG WINDOWS:  There is complete opacification of the collapsed left lung. High-density material present within the left mainstem bronchus. This was not seen on the comparison study. There is a moderate left pleural effusion. Small right pleural effusion. Patchy airspace opacity in the right upper lung.  UPPER ABDOMEN:  No acute findings.  OSSEOUS:  Remote T12 compression fracture.  Critical Value/emergent results were called by telephone at the time of interpretation on 03/04/2014 at 3:45 PM to Dr. Fayrene HelperBOWIE TRAN , who verbally acknowledged these results.  IMPRESSION 1. Occluded left mainstem bronchus (high density of this material suggests aspiration over mucoid impaction) with complete collapse of the left lung. 2. Patchy airspace disease in the right upper lobe. The distribution is not typical for aspiration while recumbent,  and may represent pneumonia. 3. Packed pacer pocket in the left chest.  SIGNATURE  Electronically Signed   By: Tiburcio Pea M.D.   On: Mar 27, 2014 15:47   Dg Chest Port 1 View  03/06/2014   CLINICAL DATA Short of breath  EXAM PORTABLE CHEST - 1 VIEW  COMPARISON Mar 27, 2014  FINDINGS Complete collapse of left  lung again noted and unchanged. Left effusion also present based on the CT from yesterday.  Increased airspace disease throughout the right lung which may be edema or pneumonia. Right arm PICC tip in the SVC.  IMPRESSION Complete collapse of left lung is unchanged  Interval development of right lung infiltrate or edema.  SIGNATURE  Electronically Signed   By: Marlan Palau M.D.   On: 03/06/2014 07:33   Dg Chest Port 1 View  03/27/14   CLINICAL DATA Low pulse ox, shortness of breath  EXAM PORTABLE CHEST - 1 VIEW  COMPARISON DG CHEST 1V PORT dated 03/01/2014  FINDINGS There is a right-sided PICC line with the tip projecting over the SVC. There is interval left lung volume loss and complete white out likely reflecting a combination of atelectatic lung and pleural fluid. There is chronic right lung interstitial thickening. There is no right lung focal parenchymal opacity. There is no pneumothorax. Stable cardiomediastinal silhouette.  The osseous structures are unremarkable.  IMPRESSION Interval left lung volume loss and complete white out likely reflecting a combination of atelectatic lung and pleural fluid.  SIGNATURE  Electronically Signed   By: Elige Ko   On: 03/27/14 12:48   ASSESSMENT / PLAN:  Left lung atelectasis, likely secondary to mucus plug, less likely endobronchial lesion - no improvement with conservative therapy Possible HCAP vs aspiration pneumonia given episodes of emesis Left pleural effusion secondary to lung collapse, less likely parapneumonic effusion or empyema Hypoxemic respiratory failure DNR/DNI status, however would not oppose short term intubation for reversible cause  -->  Transfer to ICU -->  Intubation, mechanical ventilation -->  Diagnostic / therapeutic bronchoscopy -->  Hold Coumadin as may require thoracentesis / biopsy -->  Supplemental oxygen to SpO2>92 -->  Continue Albuterol / Mucomyst Nebs -->  Continue Chest physical therapy to L lung -->  Follow  CXR -->  Send PCT -->  SLP evaluation   I have personally obtained history, examined patient, evaluated and interpreted laboratory and imaging results, reviewed medical records, formulated assessment / plan and placed orders.  Lonia Farber, MD Pulmonary and Critical Care Medicine [facility] Pager: [phone]  03/06/2014, 12:23 PM

## 2014-03-06 NOTE — Procedures (Deleted)
Bedside Bronchoscopy Procedure Note [name] DresserBetty Daniels [id number] Mar 11, 1930  Procedure: Bronchoscopy Indications: Diagnostic evaluation of the airways, Obtain specimens for culture and/or other diagnostic studies and Remove secretions  Procedure Details: ET Tube Size: ET Tube secured at lip (cm): Bite block in place: No In preparation for procedure, Patient hyper-oxygenated with 100 % FiO2 Airway entered and the following bronchi were examined: RUL, RML, RLL, LUL and LLL.   Bronchoscope removed.    Evaluation BP 77/23  Pulse 57  Temp(Src) 98 F (36.7 C) (Oral)  Resp 17  Ht 4' 11.06" (1.5 m)  Wt 115 lb 11.9 oz (52.5 kg)  BMI 23.33 kg/m2  SpO2 98% Breath Sounds:Clear and Diminished O2 sats: stable throughout Patient's Current Condition: stable Specimens:  Complications: No apparent complications Patient did tolerate procedure well.   Cherylin MylarDoyle, Miciah Covelli 03/06/2014, 3:21 PM

## 2014-03-07 ENCOUNTER — Encounter (HOSPITAL_COMMUNITY): Payer: Commercial Managed Care - HMO

## 2014-03-07 ENCOUNTER — Inpatient Hospital Stay (HOSPITAL_COMMUNITY): Payer: Medicare HMO

## 2014-03-07 DIAGNOSIS — I4891 Unspecified atrial fibrillation: Secondary | ICD-10-CM

## 2014-03-07 LAB — GLUCOSE, CAPILLARY
GLUCOSE-CAPILLARY: 122 mg/dL — AB (ref 70–99)
Glucose-Capillary: 106 mg/dL — ABNORMAL HIGH (ref 70–99)
Glucose-Capillary: 113 mg/dL — ABNORMAL HIGH (ref 70–99)
Glucose-Capillary: 114 mg/dL — ABNORMAL HIGH (ref 70–99)
Glucose-Capillary: 128 mg/dL — ABNORMAL HIGH (ref 70–99)
Glucose-Capillary: 131 mg/dL — ABNORMAL HIGH (ref 70–99)

## 2014-03-07 LAB — CBC WITH DIFFERENTIAL/PLATELET
BASOS PCT: 0 % (ref 0–1)
Basophils Absolute: 0 10*3/uL (ref 0.0–0.1)
Eosinophils Absolute: 0.2 10*3/uL (ref 0.0–0.7)
Eosinophils Relative: 1 % (ref 0–5)
HEMATOCRIT: 28.9 % — AB (ref 36.0–46.0)
HEMOGLOBIN: 9.7 g/dL — AB (ref 12.0–15.0)
LYMPHS ABS: 1.1 10*3/uL (ref 0.7–4.0)
Lymphocytes Relative: 6 % — ABNORMAL LOW (ref 12–46)
MCH: 31.6 pg (ref 26.0–34.0)
MCHC: 33.6 g/dL (ref 30.0–36.0)
MCV: 94.1 fL (ref 78.0–100.0)
MONO ABS: 1.7 10*3/uL — AB (ref 0.1–1.0)
MONOS PCT: 9 % (ref 3–12)
NEUTROS ABS: 16 10*3/uL — AB (ref 1.7–7.7)
Neutrophils Relative %: 84 % — ABNORMAL HIGH (ref 43–77)
Platelets: 251 10*3/uL (ref 150–400)
RBC: 3.07 MIL/uL — ABNORMAL LOW (ref 3.87–5.11)
RDW: 16.1 % — ABNORMAL HIGH (ref 11.5–15.5)
WBC: 19.1 10*3/uL — ABNORMAL HIGH (ref 4.0–10.5)

## 2014-03-07 LAB — RESPIRATORY VIRUS PANEL
Adenovirus: NOT DETECTED
INFLUENZA A H1: NOT DETECTED
Influenza A H3: NOT DETECTED
Influenza A: NOT DETECTED
Influenza B: NOT DETECTED
METAPNEUMOVIRUS: NOT DETECTED
Parainfluenza 1: NOT DETECTED
Parainfluenza 2: NOT DETECTED
Parainfluenza 3: NOT DETECTED
Respiratory Syncytial Virus A: NOT DETECTED
Respiratory Syncytial Virus B: NOT DETECTED
Rhinovirus: NOT DETECTED

## 2014-03-07 LAB — COMPREHENSIVE METABOLIC PANEL
ALBUMIN: 2 g/dL — AB (ref 3.5–5.2)
ALK PHOS: 70 U/L (ref 39–117)
ALT: 121 U/L — AB (ref 0–35)
AST: 92 U/L — ABNORMAL HIGH (ref 0–37)
BUN: 24 mg/dL — AB (ref 6–23)
CO2: 21 mEq/L (ref 19–32)
Calcium: 7.7 mg/dL — ABNORMAL LOW (ref 8.4–10.5)
Chloride: 102 mEq/L (ref 96–112)
Creatinine, Ser: 1.59 mg/dL — ABNORMAL HIGH (ref 0.50–1.10)
GFR calc Af Amer: 34 mL/min — ABNORMAL LOW (ref >90)
GFR calc non Af Amer: 29 mL/min — ABNORMAL LOW (ref >90)
Glucose, Bld: 116 mg/dL — ABNORMAL HIGH (ref 70–99)
POTASSIUM: 3.5 meq/L — AB (ref 3.7–5.3)
SODIUM: 137 meq/L (ref 137–147)
TOTAL PROTEIN: 5.8 g/dL — AB (ref 6.0–8.3)
Total Bilirubin: 0.4 mg/dL (ref 0.3–1.2)

## 2014-03-07 LAB — STREP PNEUMONIAE URINARY ANTIGEN: STREP PNEUMO URINARY ANTIGEN: NEGATIVE

## 2014-03-07 LAB — PROTIME-INR
INR: 2.09 — ABNORMAL HIGH (ref 0.00–1.49)
Prothrombin Time: 22.8 seconds — ABNORMAL HIGH (ref 11.6–15.2)

## 2014-03-07 LAB — MAGNESIUM: Magnesium: 1.6 mg/dL (ref 1.5–2.5)

## 2014-03-07 LAB — PHOSPHORUS: PHOSPHORUS: 2.8 mg/dL (ref 2.3–4.6)

## 2014-03-07 LAB — PROCALCITONIN: PROCALCITONIN: 0.41 ng/mL

## 2014-03-07 MED ORDER — SODIUM CHLORIDE 0.9 % IV BOLUS (SEPSIS)
500.0000 mL | Freq: Once | INTRAVENOUS | Status: AC
  Administered 2014-03-07: 500 mL via INTRAVENOUS

## 2014-03-07 MED ORDER — DEXTROSE 5 % IV SOLN: 1.0000 g | INTRAVENOUS | Status: AC

## 2014-03-07 MED ORDER — VANCOMYCIN HCL IN DEXTROSE 750-5 MG/150ML-% IV SOLN: 750.0000 mg | INTRAVENOUS | Status: AC

## 2014-03-07 MED ORDER — VITAL HIGH PROTEIN PO LIQD: 1000.0000 mL | ORAL | Status: AC

## 2014-03-07 MED ORDER — DEXTROSE 5 % IV SOLN: 30.0000 ug/min | INTRAVENOUS | Status: AC

## 2014-03-07 MED ORDER — SODIUM CHLORIDE 0.9 % IV SOLN: 0.0000 ug/h | INTRAVENOUS | Status: AC

## 2014-03-07 MED ORDER — VITAL HIGH PROTEIN PO LIQD
1000.0000 mL | ORAL | Status: DC
  Filled 2014-03-07 (×2): qty 1000

## 2014-03-07 MED ORDER — PANTOPRAZOLE SODIUM 40 MG IV SOLR: 40.0000 mg | INTRAVENOUS | Status: AC

## 2014-03-07 MED ORDER — VITAL AF 1.2 CAL PO LIQD
1000.0000 mL | ORAL | Status: DC
  Administered 2014-03-07: 1000 mL
  Filled 2014-03-07 (×3): qty 1000

## 2014-03-07 MED ORDER — SODIUM CHLORIDE 0.9 % IV SOLN: 100.0000 mL | INTRAVENOUS | Status: AC

## 2014-03-07 MED ORDER — ALBUTEROL SULFATE (2.5 MG/3ML) 0.083% IN NEBU: 2.5000 mg | INHALATION_SOLUTION | RESPIRATORY_TRACT | Status: AC

## 2014-03-07 MED ORDER — NOREPINEPHRINE BITARTRATE 1 MG/ML IJ SOLN
2.0000 ug/min | INTRAVENOUS | Status: DC
  Administered 2014-03-08: 5 ug/min via INTRAVENOUS
  Filled 2014-03-07: qty 4

## 2014-03-07 MED ORDER — SODIUM CHLORIDE 0.9 % IV SOLN: 1.0000 mg/h | INTRAVENOUS | Status: AC

## 2014-03-07 MED ORDER — ACETYLCYSTEINE 20 % IN SOLN: 3.0000 mL | RESPIRATORY_TRACT | Status: AC

## 2014-03-07 NOTE — Progress Notes (Signed)
INITIAL NUTRITION ASSESSMENT  DOCUMENTATION CODES Per approved criteria  -Not Applicable   INTERVENTION:  Utilize 50M PEPuP Protocol: initiate TF via OGT with Vital AF 1.2 at 25 ml/h on day 1; on day 2, increase to goal rate of 40 ml/h (960 ml per day) to provide 1152 kcals, 72 gm protein, 779 ml free water daily.  NUTRITION DIAGNOSIS: Inadequate oral intake related to inability to eat as evidenced by NPO status.   Goal: Intake to meet >90% of estimated nutrition needs.  Monitor:  TF tolerance/adequacy, weight trend, labs, vent status.  Reason for Assessment: MD Consult for TF initiation and management.  80 y.o. female  Admitting Dx: SOB  ASSESSMENT: Patient is a 80 yo with recent hospitalization for bacteremia secondary to infected pacemaker, sent to ED from PCP office for dyspnea and hypoxia. Chest imaging demonstrated L lung volume loss with L mainstem bronchus cut off sign and associated pleural effusion. 3/11 CT Chest >>> Occluded left mainstem bronchus with complete collapse of left lung and associated effusion.  S/P swallow evaluation with SLP on 3/12, cleared for Regular diet with thin liquids. Patient required intubation and bronchoscopy 3/12. Received MD Consult for TF initiation and management. Nutrition focused physical exam completed.  No muscle or subcutaneous fat depletion noticed. [facility]  Patient is currently intubated on ventilator support.  MV: 5.8 L/min Temp (24hrs), Avg:98.5 F (36.9 C), Min:97.7 F (36.5 C), Max:99.3 F (37.4 C)   Height: Ht Readings from Last 1 Encounters:  30-Nov-2014 4' 11.06" (1.5 m)    Weight: Wt Readings from Last 1 Encounters:  03/07/14 124 lb 9 oz (56.5 kg)    Ideal Body Weight: 44.5 kg  % Ideal Body Weight: 127%  Wt Readings from Last 10 Encounters:  03/07/14 124 lb 9 oz (56.5 kg)  02/28/14 115 lb 10.4 oz (52.457 kg)  02/28/14 115 lb 10.4 oz (52.457 kg)  02/28/14 115 lb  10.4 oz (52.457 kg)  02/28/14 115 lb 10.4 oz (52.457 kg)  10/18/13 116 lb (52.617 kg)  08/27/13 114 lb (51.71 kg)  07/12/13 113 lb 8.6 oz (51.5 kg)  07/12/13 113 lb 8.6 oz (51.5 kg)  07/12/13 113 lb 8.6 oz (51.5 kg)    Usual Body Weight: 115 lb  % Usual Body Weight: 108%  BMI:  Body mass index is 25.11 kg/(m^2).  Estimated Nutritional Needs: Kcal: 1120 Protein: 65-80 gm Fluid: 1.1-1.2 L  Skin: left chest incision  Diet Order: NPO  EDUCATION NEEDS: -No education needs identified at this time   Intake/Output Summary (Last 24 hours) at 03/07/14 1154 Last data filed at 03/07/14 1100  Gross per 24 hour  Intake 3523.65 ml  Output    595 ml  Net 2928.65 ml    Last BM: PTA   Labs:   Recent Labs Lab 30-Nov-2014 1226 03/06/14 0545 03/07/14 0442  NA 136* 140 137  K 3.2* 4.5 3.5*  CL 95* 101 102  CO2 23 24 21   BUN 28* 24* 24*  CREATININE 1.35* 1.24* 1.59*  CALCIUM 8.5 8.0* 7.7*  MG  --  1.8 1.6  GLUCOSE 121* 109* 116*    CBG (last 3)   Recent Labs  03/07/14 0004 03/07/14 0403 03/07/14 0817  GLUCAP 122* 106* 113*    Scheduled Meds: . acetylcysteine  3 mL Nebulization Q4H  . albuterol  2.5 mg Nebulization Q4H  . amiodarone  200 mg Oral Daily  . antiseptic oral rinse  15 mL  Mouth Rinse QID  . atorvastatin  20 mg Oral QHS  . ceFEPime (MAXIPIME) IV  1 g Intravenous Q24H  . chlorhexidine  15 mL Mouth Rinse BID  . vitamin B-12  500 mcg Oral Daily  . feeding supplement (VITAL HIGH PROTEIN)  1,000 mL Per Tube Q24H  . ferrous sulfate  325 mg Oral Q breakfast  . pantoprazole (PROTONIX) IV  40 mg Intravenous Q24H  . vancomycin  750 mg Intravenous Q24H    Continuous Infusions: . sodium chloride 75 mL/hr (03/06/2014 1821)  . fentaNYL infusion INTRAVENOUS 75 mcg/hr (03/06/14 1900)  . midazolam (VERSED) infusion Stopped (03/06/14 1900)  . phenylephrine (NEO-SYNEPHRINE) Adult infusion 160 mcg/min (03/07/14 1145)    Past Medical History  Diagnosis Date  .  Atrial fibrillation     a. Dx 12/2011. b. Adm at [facility] 2014 placed on sotalol but had bradycardia with this. c. Adm 06/2013 with AF-RVR - due to h/o bradycardia, underwent Medtronic ppm & amiodarone initiation (spont conv to NSR).   [name] GERD (gastroesophageal reflux disease)   . Stroke, lacunar-identified by CT scanning 01/01/2012  . CHF (congestive heart failure)     a. Admission for HFpEF at [facility] ~06/2013.  [name] Pneumonia 06/2013  . Renal insufficiency     a. noted by labs 06/2013.  . Bacteremia 02/23/2014    gram positive cocci in clusters  . Chronotropic incompetence with sinus node dysfunction 10/18/2013    Past Surgical History  Procedure Laterality Date  . Tonsillectomy  ~ 1950  . Cataract extraction w/ intraocular lens implant Left 07/08/2013  . Pacemaker insertion  07-10-2013    MDT ADDRL1 pacemaker implanted by Dr [name] for tachy-brady syndrome  . Pacemaker removal  02/25/14    Pacemaker system removed by Dr Johney Frame for bacteremia  . Tee without cardioversion N/A 02/26/2014    Procedure: TRANSESOPHAGEAL ECHOCARDIOGRAM (TEE);  Surgeon: Chrystie Nose, MD;  Location[facility] ENDOSCOPY;  Service: Cardiovascular;  Laterality: N/A;     Joaquin Courts, RD, LDN, CNSC Pager [phone] After Hours Pager [phone]

## 2014-03-07 NOTE — Progress Notes (Signed)
SLP Cancellation Note  Patient Details Name: [name] Daniels MRN: [id number] DOB: 11/17/1930   Cancelled treatment:       Reason Eval/Treat Not Completed: Medical issues which prohibited therapy. Pt intubated. Will d/c orders. Please reorder when pt ready to participate.    Mikaele Stecher, Riley NearingBonnie Caroline 03/07/2014, 7:34 AM

## 2014-03-07 NOTE — Discharge Summary (Signed)
Physician Discharge Summary  Patient ID: [name] Daniels MRN: [id number] DOB/AGE: 06-22-1930 80 y.o.  Admit date: 02/28/2014 Discharge date: 08-30-2014   Problem List Active Problems:   Stroke, lacunar-identified by CT scanning   HTN (hypertension)   Chronic diastolic heart failure   Pacemaker-Medtronic   Pacemaker infection   Renal insufficiency   Bacteremia   HCAP (healthcare-associated pneumonia)   Pneumonia   Aspiration pneumonia   Acute respiratory failure with hypoxia   Pleural effusion   Airway obstruction, anatomic  HPI: [name] Daniels is a 80 y.o. female with a past medical history of tachybradycardia syndrome, status post pacemaker implant in July of 2014, atrial fibrillation, on chronic anticoagulation, history of CVA, diastolic congestive heart failure, who was admitted to the cardiology service on 02/23/2014 for pacemaker infection. She was initially started on IV vancomycin, undergoing device exploration on 02/24/2014 which showed significant purulence within pacemaker pocket. Will culture sets as she underwent debridement of pocket. Extraction of pacemaker was performed on 02/25/2014, with removal of pulse generator and atrial and ventricular leads. One out of two blood cultures that were drawn on 02/23/2014 grew coagulase-negative Staphylococcus species. She was seen by infectious disease during that hospitalization as a two-week course of vancomycin was recommended. She was discharged on 02/28/2014 with Vancomycin 750 mg IV every 24 hours with home health services following. Patient reporting the development of nonexertional shortness of breath that started on Monday 03/03/2014 associated with a nonproductive cough. Her daughter present at bedside reported O2 sats in the 6770s which improved after a "breathing treatment." She reports associated nausea, vomiting, malaise, generalized weakness. She denies associated chest pain, sputum production, fever, chills, palpitations, syncope.  Patient presented to her primary care physician's office with the above mentioned complaints, was found to have oxygen saturations of 76% and referred to the emergency room. Initial chest x-ray showed interval left lung volume loss a complete whiteout likely reflecting a combination of atelectatic lung and pleural fluid. This was further worked up with a CT scan of chest without contrast which showed an occluded left main stem bronchus with complete collapse of the left lung. Also noted was patchy airspace disease in the right upper lobe. She was administered cefepime. Patient was evaluated by pulmonary critical care medicine in the emergency room.   Hospital Course: SIGNIFICANT EVENTS / STUDIES:  3/11 CT Chest >>> Occluded left mainstem bronchus with complete collapse of left lung and associated effusion  3/12 SLP eval >>>  3/12 FOB >>> Dysphagia unlikely   3/13 [facility] FOB LINES / TUBES:  3/12 OTT>> Rt picc>>  CULTURES:  3/11 Blood >>>   ANTIBIOTICS:  Cefepime 3/11 >>>  Vancomycin 3/11 >>>   ASSESSMENT / PLAN:  Left lung atelectasis, likely secondary to mucus plug, less likely endobronchial lesion - no improvement with conservative therapy  Possible HCAP vs aspiration pneumonia given episodes of emesis  Left pleural effusion secondary to lung collapse, less likely parapneumonic effusion or empyema  Hypoxemic respiratory failure  DNR/DNI status, however would not oppose short term intubation for reversible cause  --> Transfer to ICU  --> Intubation, mechanical ventilation  --> Diagnostic / therapeutic bronchoscopy , failed to remove barium due to lack of cyro probe. [facility].  --> Hold Coumadin   --> MVS --> Continue Albuterol / Mucomyst Nebs  --> Continue Chest physical therapy to L lung  --> Follow CXR    -->[facility] plan initiated 3./13/15 but 05/17/2014 patient  decompensated to multi organ failure. fAmily meeting  held and patient palliated due to very grim prognosis and no chance for recovery. Patient expired 02/28/2014       Discharged Condition:  Dead   Dr. Kalman Shan, M.D., [facility].C.P Pulmonary and Critical Care Medicine Staff Physician [facility] [place] Pulmonary and Critical Care Pager: [phone], If no answer or between  15:00h - 7:00h: call [phone]  04/24/2014 4:20 PM

## 2014-03-07 NOTE — Progress Notes (Signed)
[facility] PCCM  I was asked by my partner to review the case.  Images from bronchoscopy and CT reviewed.  Contrast density and fat density opacification in the left mainstem bronchus along with calcification of the bronchial airway.  The bronchoscopy demonstrated "cement-like" material in the airway in addition to mucosal edema.  The orifice of the left lower lobe was patent but partially obstructed due to the material in the airway in addition to mucosal edema.  The material could not be extracted with the bronchoscope.  Today's CXR showed aeration of the left lung, but we feel this is most likely due to positive pressure ventilation.  Case [facility] who agrees that she may benefit from interventional bronchoscopy (possible use of a cryo probe for extraction of the airway material).  I have discussed the situation with Mauricio Poegina York, a family friend and nurse practioner as well as the patient's niece Sophronia SimasDeborah Marsh.  They agree with this plan of care.  [facility].  Yolonda KidaMCQUAID, DOUGLAS [place] PCCM Pager: [phone] Cell: [phone] If no response, call [phone]

## 2014-03-07 NOTE — Progress Notes (Signed)
[place] ICU Electrolyte Replacement Protocol  Patient Name: [name] Daniels DOB: 02-Sep-1930 MRN: [id number]  Date of Service  03/07/2014   HPI/Events of Note    Recent Labs Lab 03/14/2014 1226 03/06/14 0545 03/07/14 0442  NA 136* 140 137  K 3.2* 4.5 3.5*  CL 95* 101 102  CO2 23 24 21   GLUCOSE 121* 109* 116*  BUN 28* 24* 24*  CREATININE 1.35* 1.24* 1.59*  CALCIUM 8.5 8.0* 7.7*  MG  --  1.8 1.6    Estimated Creatinine Clearance: 21.3 ml/min (by C-G formula based on Cr of 1.59).  Intake/Output     03/12 0701 - 03/13 0700   I.V. (mL/kg) 1821.2 (32.2)   IV Piggyback 1000   Total Intake(mL/kg) 2821.2 (49.9)   Urine (mL/kg/hr) 495 (0.4)   Total Output 495   Net +2326.2        - I/O DETAILED x24h    Total I/O In: 1418.9 [I.V.:1418.9] Out: 195 [Urine:195] - I/O THIS SHIFT    ASSESSMENT   eICURN Interventions  Electrolyte protocol criteria NOT met. Lab values NOT replaced per protocol. MD notified.    ASSESSMENT: MAJOR ELECTROLYTE    [name] Daniels 03/07/2014, 6:40 AM

## 2014-03-07 NOTE — Progress Notes (Addendum)
PULMONARY / CRITICAL CARE MEDICINE  Name: [name] Daniels MRN: [id number] DOB: [birth year]    ADMISSION DATE:  03/22/2014 CONSULTATION DATE:  03/25/2014  REFERRING MD :  EDP PRIMARY SERVICE:  TRH  CHIEF COMPLAINT:  SOB  BRIEF PATIENT DESCRIPTION: 80 yo with recent hospitalization for bacteremia secondary to infected pacemaker, sent to ED from PCP office for dyspnea and hypoxia.  Chest imaging demonstrated L lung volume loss with L mainstem bronchus cut off sign and associated pleural effusion.  SIGNIFICANT EVENTS / STUDIES:  3/11  CT Chest >>> Occluded left mainstem bronchus with complete collapse of left lung and associated effusion 3/12  FOB >>> Barium aspiration likely, near complete obstruction of lobar airways on the left  LINES / TUBES: OETT 3/12 >>>  CULTURES: 3/11 Blood >>>  ANTIBIOTICS: Cefepime 3/11 >>> Vancomycin 3/11 >>>  INTERVAL HISTORY:  Borderline hypotensive overnight.  L lung aeration somewhat improved  VITAL SIGNS: Temp:  [97.7 F (36.5 C)-99.3 F (37.4 C)] 99.3 F (37.4 C) (03/13 0843) Pulse Rate:  [48-86] 66 (03/13 1100) Resp:  [10-29] 16 (03/13 1100) BP: (42-151)/(21-86) 105/29 mmHg (03/13 1100) SpO2:  [88 %-100 %] 95 % (03/13 1100) FiO2 (%):  [40 %-100 %] 40 % (03/13 0800) Weight:  [56.5 kg (124 lb 9 oz)] 56.5 kg (124 lb 9 oz) (03/13 0405)  PHYSICAL EXAMINATION: General: Mechanically ventilated, synchronous Neuro: Well sedated HEENT: OETT Cardiovascular: Irregular, no murmurs Lungs: Air movement diminished over L lung field, few rhonchi, weak cough Abdomen: soft, non tender Musculoskeletal: No edema.  Skin: No rash   Recent Labs Lab 03/24/2014 1226 03/06/14 0545 03/07/14 0442  NA 136* 140 137  K 3.2* 4.5 3.5*  CL 95* 101 102  CO2 23 24 21   BUN 28* 24* 24*  CREATININE 1.35* 1.24* 1.59*  GLUCOSE 121* 109* 116*    Recent Labs Lab 03/14/2014 1226 03/06/14 0545 03/07/14 0442  HGB 10.8* 9.8* 9.7*  HCT 31.2* 28.5* 28.9*  WBC 15.3* 12.3*  19.1*  PLT 248 244 251   Ct Chest Wo Contrast  03/25/2014   CLINICAL DATA Shortness of breath.  Recent pacemaker removal for infection  EXAM CT CHEST WITHOUT CONTRAST  TECHNIQUE Multidetector CT imaging of the chest was performed following the standard protocol without IV contrast.  COMPARISON 06/26/2013  FINDINGS THORACIC INLET/BODY WALL:  6.4 x 2 x 5 cm collection in the left chest which has mottled lucency. This is presumably a packed wound given the lucency extends through the skin incision. No focal fluid collection within this pocket to suggest loculation. There is a right upper extremity PICC, tip at the SVC level.  MEDIASTINUM:  Normal heart size. No pericardial effusion. Atherosclerosis, including the coronary arteries. Moderate sliding-type hiatal hernia. No evidence of acute vascular abnormality. No adenopathy.  LUNG WINDOWS:  There is complete opacification of the collapsed left lung. High-density material present within the left mainstem bronchus. This was not seen on the comparison study. There is a moderate left pleural effusion. Small right pleural effusion. Patchy airspace opacity in the right upper lung.  UPPER ABDOMEN:  No acute findings.  OSSEOUS:  Remote T12 compression fracture.  Critical Value/emergent results were called by telephone at the time of interpretation on 03/18/2014 at 3:45 PM to Dr. Fayrene HelperBOWIE TRAN , who verbally acknowledged these results.  IMPRESSION 1. Occluded left mainstem bronchus (high density of this material suggests aspiration over mucoid impaction) with complete collapse of the left lung. 2. Patchy airspace disease in the right upper lobe. The  distribution is not typical for aspiration while recumbent, and may represent pneumonia. 3. Packed pacer pocket in the left chest.  SIGNATURE  Electronically Signed   By: Tiburcio Pea M.D.   On: 03/25/2014 15:47   Dg Chest Port 1 View  03/07/2014   CLINICAL DATA:  Infiltrates.  EXAM: PORTABLE CHEST - 1 VIEW  COMPARISON:  DG  CHEST 1V PORT dated 03/06/2014; CT CHEST W/O CM dated 03/09/2014  FINDINGS: Endotracheal tube and right PICC line in stable position. Mediastinum and hilar structures are stable. Stable cardiomegaly. Slight improvement in aeration of the left lung with persistent dense infiltrates throughout the left lung. Persistent left lower lobe atelectatic changes. Interim progression of patchy infiltrates throughout the right upper lobe. No pneumothorax.  IMPRESSION: 1. Lines and tubes in stable position. 2. Interim improved aeration left lung with persistent dense infiltrate in the left lung particularly left upper lobe and persistent left lower lobe atelectatic change. 3. Progressive patchy pulmonary infiltrates right lung, particular right upper lobe.   Electronically Signed   By: Maisie Fus  Register   On: 03/07/2014 08:34   Dg Chest Port 1 View  03/06/2014   CLINICAL DATA:  Check for infiltrate  EXAM: PORTABLE CHEST - 1 VIEW  COMPARISON:  03/06/2014  FINDINGS: Cardiac shadow is stable. There is improved aeration in the left hemi thorax although persistent density is noted in the left suprahilar region likely representing an underlying mass/consolidated lung. Continued followup is recommended. A PICC line is seen with the tip in the mid superior vena cava. It is stable in appearance. An endotracheal tube is been placed and lies 1.8 cm above the carina. Diffuse infiltrative changes are noted throughout the right lung stable from the prior exam.  IMPRESSION: Improved aeration in the left hemi thorax with persistent consolidation/mass lesion in the left upper lobe.  Endotracheal tube in satisfactory position.  Stable changes in the right lung.   Electronically Signed   By: Alcide Clever M.D.   On: 03/06/2014 16:32   Dg Chest Port 1 View  03/06/2014   CLINICAL DATA Short of breath  EXAM PORTABLE CHEST - 1 VIEW  COMPARISON 03/25/2014  FINDINGS Complete collapse of left lung again noted and unchanged. Left effusion also present  based on the CT from yesterday.  Increased airspace disease throughout the right lung which may be edema or pneumonia. Right arm PICC tip in the SVC.  IMPRESSION Complete collapse of left lung is unchanged  Interval development of right lung infiltrate or edema.  SIGNATURE  Electronically Signed   By: Marlan Palau M.D.   On: 03/06/2014 07:33   Dg Chest Port 1 View  03/03/2014   CLINICAL DATA Low pulse ox, shortness of breath  EXAM PORTABLE CHEST - 1 VIEW  COMPARISON DG CHEST 1V PORT dated 03/01/2014  FINDINGS There is a right-sided PICC line with the tip projecting over the SVC. There is interval left lung volume loss and complete white out likely reflecting a combination of atelectatic lung and pleural fluid. There is chronic right lung interstitial thickening. There is no right lung focal parenchymal opacity. There is no pneumothorax. Stable cardiomediastinal silhouette.  The osseous structures are unremarkable.  IMPRESSION Interval left lung volume loss and complete white out likely reflecting a combination of atelectatic lung and pleural fluid.  SIGNATURE  Electronically Signed   By: Elige Ko   On: 03/13/2014 12:48   ASSESSMENT / PLAN:  PULMONARY A:   Acute respiratory failure Barium aspiration Less likely  pneumonia Airway obstruction by foreign body Pleural effusion  P:   Goal SpO2>92, pH>7.30 Full mechanical support Daily SBT Ventilator bundle Trend ABG / CXR Albuterol / Mucomyst Awaiting transfer to [facility] Interventional Pulmonology Service for cryotherapy foreign body removal attempt   CARDIOVASCULAR A:  Hypotension, likely sedation / positive pressure ventilation related Doubt sepsis AF, controlled rate P:  Goal MAP>60 Neo-synephrine gtt  RENAL A:   Renal failure, acute vs chronic Hypokalemia P:   Trend BMP IVF Replete K as needed  GASTROINTESTINAL A:   Nutrition GI Px P:   NPO as intubated Consider TF if not transferred today Protonix  HEMATOLOGIC A:   Anemia Therapeutic anticoagulation for AF VTE Px P:  Trend CBC Hold Coumadin as expect interventions SCDs  INFECTIOUS A:   Suspected pneumonia P:   Abx / cx as above  ENDOCRINE  A:   No active issues P:   No intervention required   NEUROLOGIC A:   Ventilator synchrony P:   Goal RASS 0 to -1 Daily WUA Fentanyl / Versed gtt  I have personally obtained history, examined patient, evaluated and interpreted laboratory and imaging results, reviewed medical records, formulated assessment / plan and placed orders.  CRITICAL CARE:  The patient is critically ill with multiple organ systems failure and requires high complexity decision making for assessment and support, frequent evaluation and titration of therapies, application of advanced monitoring technologies and extensive interpretation of multiple databases. Critical Care Time devoted to patient care services described in this note is 35 minutes.   Lonia Farber, MD Pulmonary and Critical Care Medicine [facility] Pager: [phone]  03/07/2014, 11:24 AM

## 2014-03-07 NOTE — Progress Notes (Signed)
eLink Physician-Brief Progress Note Patient Name: [name] Daniels DOB: [birth year] MRN: [id number]  Date of Service  03/07/2014   HPI/Events of Note  Hypotension despite max NEO gtt at 200 mcg via PICC line.  Current HR of 68 with MAP of 49 and systolic BP of 95.  Goal had been systolic BP of greater than 100.  Have not been achieved.  Oliguria   eICU Interventions  Plan: RT to insert aline for HD monitoring Start NE gtt for BP support Continue to monitor patient   Intervention Category Major Interventions: Hypotension - evaluation and management  DETERDING,ELIZABETH 03/07/2014, 11:39 PM

## 2014-03-07 NOTE — Progress Notes (Signed)
CPT is on hold per MD.

## 2014-03-08 DIAGNOSIS — R6521 Severe sepsis with septic shock: Secondary | ICD-10-CM

## 2014-03-08 DIAGNOSIS — Z515 Encounter for palliative care: Secondary | ICD-10-CM

## 2014-03-08 DIAGNOSIS — R0602 Shortness of breath: Secondary | ICD-10-CM

## 2014-03-08 DIAGNOSIS — N179 Acute kidney failure, unspecified: Secondary | ICD-10-CM

## 2014-03-08 DIAGNOSIS — T827XXA Infection and inflammatory reaction due to other cardiac and vascular devices, implants and grafts, initial encounter: Secondary | ICD-10-CM

## 2014-03-08 DIAGNOSIS — A419 Sepsis, unspecified organism: Secondary | ICD-10-CM

## 2014-03-08 DIAGNOSIS — Z66 Do not resuscitate: Secondary | ICD-10-CM

## 2014-03-08 LAB — CBC WITH DIFFERENTIAL/PLATELET
BASOS ABS: 0 10*3/uL (ref 0.0–0.1)
Basophils Relative: 0 % (ref 0–1)
Eosinophils Absolute: 0 10*3/uL (ref 0.0–0.7)
Eosinophils Relative: 0 % (ref 0–5)
HCT: 32 % — ABNORMAL LOW (ref 36.0–46.0)
Hemoglobin: 10.5 g/dL — ABNORMAL LOW (ref 12.0–15.0)
LYMPHS ABS: 0.8 10*3/uL (ref 0.7–4.0)
Lymphocytes Relative: 3 % — ABNORMAL LOW (ref 12–46)
MCH: 31.6 pg (ref 26.0–34.0)
MCHC: 32.8 g/dL (ref 30.0–36.0)
MCV: 96.4 fL (ref 78.0–100.0)
Monocytes Absolute: 1.6 10*3/uL — ABNORMAL HIGH (ref 0.1–1.0)
Monocytes Relative: 6 % (ref 3–12)
Neutro Abs: 25 10*3/uL — ABNORMAL HIGH (ref 1.7–7.7)
Neutrophils Relative %: 91 % — ABNORMAL HIGH (ref 43–77)
Platelets: 246 10*3/uL (ref 150–400)
RBC: 3.32 MIL/uL — ABNORMAL LOW (ref 3.87–5.11)
RDW: 16.6 % — AB (ref 11.5–15.5)
WBC: 27.4 10*3/uL — AB (ref 4.0–10.5)

## 2014-03-08 LAB — GLUCOSE, CAPILLARY
GLUCOSE-CAPILLARY: 138 mg/dL — AB (ref 70–99)
GLUCOSE-CAPILLARY: 158 mg/dL — AB (ref 70–99)
GLUCOSE-CAPILLARY: 161 mg/dL — AB (ref 70–99)
GLUCOSE-CAPILLARY: 177 mg/dL — AB (ref 70–99)
Glucose-Capillary: 201 mg/dL — ABNORMAL HIGH (ref 70–99)
Glucose-Capillary: 171 mg/dL — ABNORMAL HIGH (ref 70–99)

## 2014-03-08 LAB — PHOSPHORUS
PHOSPHORUS: 5.9 mg/dL — AB (ref 2.3–4.6)
Phosphorus: 5.6 mg/dL — ABNORMAL HIGH (ref 2.3–4.6)

## 2014-03-08 LAB — LEGIONELLA ANTIGEN, URINE: Legionella Antigen, Urine: NEGATIVE

## 2014-03-08 LAB — COMPREHENSIVE METABOLIC PANEL
ALT: 163 U/L — AB (ref 0–35)
AST: 198 U/L — AB (ref 0–37)
Albumin: 1.9 g/dL — ABNORMAL LOW (ref 3.5–5.2)
Alkaline Phosphatase: 82 U/L (ref 39–117)
BILIRUBIN TOTAL: 0.4 mg/dL (ref 0.3–1.2)
BUN: 28 mg/dL — ABNORMAL HIGH (ref 6–23)
CO2: 16 meq/L — AB (ref 19–32)
CREATININE: 2.93 mg/dL — AB (ref 0.50–1.10)
Calcium: 7.6 mg/dL — ABNORMAL LOW (ref 8.4–10.5)
Chloride: 97 mEq/L (ref 96–112)
GFR calc Af Amer: 16 mL/min — ABNORMAL LOW (ref >90)
GFR, EST NON AFRICAN AMERICAN: 14 mL/min — AB (ref >90)
GLUCOSE: 173 mg/dL — AB (ref 70–99)
Potassium: 4.4 mEq/L (ref 3.7–5.3)
Sodium: 129 mEq/L — ABNORMAL LOW (ref 137–147)
Total Protein: 6.2 g/dL (ref 6.0–8.3)

## 2014-03-08 LAB — MAGNESIUM: Magnesium: 1.6 mg/dL (ref 1.5–2.5)

## 2014-03-08 LAB — CORTISOL: Cortisol, Plasma: 34.7 ug/dL

## 2014-03-08 LAB — TROPONIN I

## 2014-03-08 LAB — PROTIME-INR
INR: 2.21 — ABNORMAL HIGH (ref 0.00–1.49)
Prothrombin Time: 23.8 seconds — ABNORMAL HIGH (ref 11.6–15.2)

## 2014-03-08 LAB — PROCALCITONIN: Procalcitonin: 3.26 ng/mL

## 2014-03-08 LAB — LACTIC ACID, PLASMA: Lactic Acid, Venous: 2.7 mmol/L — ABNORMAL HIGH (ref 0.5–2.2)

## 2014-03-08 MED ORDER — HYDROCORTISONE NA SUCCINATE PF 100 MG IJ SOLR
50.0000 mg | Freq: Four times a day (QID) | INTRAMUSCULAR | Status: DC
  Administered 2014-03-08: 50 mg via INTRAVENOUS
  Filled 2014-03-08 (×4): qty 1

## 2014-03-08 MED ORDER — MORPHINE SULFATE 10 MG/ML IJ SOLN
10.0000 mg/h | INTRAVENOUS | Status: DC
  Administered 2014-03-08: 10 mg/h via INTRAVENOUS
  Filled 2014-03-08: qty 10

## 2014-03-08 MED ORDER — LORAZEPAM 2 MG/ML IJ SOLN
5.0000 mg/h | INTRAMUSCULAR | Status: DC
  Administered 2014-03-08: 5 mg/h via INTRAVENOUS
  Filled 2014-03-08: qty 25

## 2014-03-08 MED ORDER — DEXTROSE 5 % IV SOLN
2.0000 ug/min | INTRAVENOUS | Status: DC
  Administered 2014-03-08: 25 ug/min via INTRAVENOUS
  Administered 2014-03-08: 35 ug/min via INTRAVENOUS
  Administered 2014-03-08: 25 ug/min via INTRAVENOUS
  Filled 2014-03-08 (×3): qty 16

## 2014-03-08 MED ORDER — LORAZEPAM BOLUS VIA INFUSION
2.0000 mg | INTRAVENOUS | Status: DC | PRN
  Filled 2014-03-08: qty 5

## 2014-03-08 MED ORDER — MORPHINE BOLUS VIA INFUSION
5.0000 mg | INTRAVENOUS | Status: DC | PRN
  Filled 2014-03-08: qty 20

## 2014-03-08 MED ORDER — LINEZOLID 2 MG/ML IV SOLN
600.0000 mg | Freq: Two times a day (BID) | INTRAVENOUS | Status: DC
  Administered 2014-03-08: 600 mg via INTRAVENOUS
  Filled 2014-03-08 (×2): qty 300

## 2014-03-08 MED ORDER — PIPERACILLIN-TAZOBACTAM IN DEX 2-0.25 GM/50ML IV SOLN
2.2500 g | Freq: Three times a day (TID) | INTRAVENOUS | Status: DC
  Administered 2014-03-08: 2.25 g via INTRAVENOUS
  Filled 2014-03-08 (×3): qty 50

## 2014-03-11 LAB — CULTURE, BLOOD (ROUTINE X 2)
Culture: NO GROWTH
Culture: NO GROWTH

## 2014-03-21 ENCOUNTER — Encounter: Payer: Self-pay | Admitting: Internal Medicine

## 2014-03-26 NOTE — Progress Notes (Signed)
Contacted by Glori BickersM Ramaswami earlier.  Patient familiar to our service  Call regarding pacemaker site dressing. Family member present.  Discussed plans for withdrawal of therapy later today.  Will not make any changes in care.

## 2014-03-26 NOTE — Progress Notes (Signed)
eLink Physician-Brief Progress Note Patient Name: Buford DresserBetty Rubis DOB: 11/14/1930 MRN: [id number]  Date of Service  03-03-2014   HPI/Events of Note   Family electing to withdraw support later tonight.  eICU Interventions   [facility] notified and transfer cancelled.    Intervention Category Minor Interventions: Communication with other healthcare providers and/or family  Pardeep Pautz R. 03-03-2014, 4:57 PM

## 2014-03-26 NOTE — Procedures (Signed)
Extubation Procedure Note  Patient Details:   Name: [name] DresserBetty Daniels DOB: 08-23-1930 MRN: [id number]   Airway Documentation: Pt was terminally extubated per MD order and Family request.   Evaluation Terminal extubation  Elmer PickerClifton, Teona Vargus D 06/14/14, 8:32 PM

## 2014-03-26 NOTE — Consult Note (Signed)
Renal Service Consult Note [facility]  [name] Daniels 03-14-2014 [place] Requesting Physician:  Dr Chase Caller  Reason for Consult:  Acute renal failure HPI: The patient is a 80 y.o. year-old with hx of afib, CVA, diast CHF and pacemaker insertion for tachy-brady syndrome.  Pt was recently here for pacemaker pocket infection with CNSS and was discharged on IV vancomycin  She presented then on 02/24/2014 with SOB.  CXR and CT showed bilat PNA L > R and occluded L mainstem bronchus by CT.  She was admitted to ICU, intubated and started on broad spec abx.  Bronch showed near complete obstruction of lobar airways on the left. Pt in shock on high dose of phenylephrine drip and levophed drip, and UOP is poor.  Pt was DNR/DNI prior to these events and underwent short-term intubation in case of acutely reversible disease. Prognosis poor, renal service and cardiology service asked for input.    ROS  not available  Past Medical History  Past Medical History  Diagnosis Date  . Atrial fibrillation     a. Dx 12/2011. b. Adm at [facility] 2014 placed on sotalol but had bradycardia with this. c. Adm 06/2013 with AF-RVR - due to h/o bradycardia, underwent [employer] ppm & amiodarone initiation (spont conv to NSR).   [name] GERD (gastroesophageal reflux disease)   . Stroke, lacunar-identified by CT scanning 01/01/2012  . CHF (congestive heart failure)     a. Admission for HFpEF at [facility] ~06/2013.  [name] Pneumonia 06/2013  . Renal insufficiency     a. noted by labs 06/2013.  . Bacteremia 02/23/2014    gram positive cocci in clusters  . Chronotropic incompetence with sinus node dysfunction 10/18/2013   Past Surgical History  Past Surgical History  Procedure Laterality Date  . Tonsillectomy  ~ 1950  . Cataract extraction w/ intraocular lens implant Left 07/08/2013  . Pacemaker insertion  07-10-2013    MDT ADDRL1 pacemaker implanted by Dr Caryl Comes for tachy-brady syndrome  . Pacemaker removal  02/25/14     Pacemaker system removed by Dr Rayann Heman for bacteremia  . Tee without cardioversion N/A 02/26/2014    Procedure: TRANSESOPHAGEAL ECHOCARDIOGRAM (TEE);  Surgeon: Pixie Casino, MD;  Location: [facility] ENDOSCOPY;  Service: Cardiovascular;  Laterality: N/A;   Family History No family history on file. Social History  reports that she has never smoked. She has never used smokeless tobacco. She reports that she does not drink alcohol or use illicit drugs. Allergies  Allergies  Allergen Reactions  . Levaquin [Levofloxacin In D5w] Nausea And Vomiting   Home medications Prior to Admission medications   Medication Sig Start Date End Date Taking? Authorizing Provider  amiodarone (PACERONE) 200 MG tablet Take 1 tablet (200 mg total) by mouth daily. 12/05/13  Yes Deboraha Sprang, MD  atorvastatin (LIPITOR) 20 MG tablet Take 20 mg by mouth at bedtime.  07/02/13  Yes Deboraha Sprang, MD  vitamin B-12 (CYANOCOBALAMIN) 500 MCG tablet Take 500 mcg by mouth daily.   Yes Historical Provider, MD  acetylcysteine (MUCOMYST) 20 % nebulizer solution Take 3 mLs by nebulization every 4 (four) hours. 03/07/14   Grace Bushy Minor, NP  albuterol (PROVENTIL) (2.5 MG/3ML) 0.083% nebulizer solution Take 3 mLs (2.5 mg total) by nebulization every 4 (four) hours. 03/07/14   Grace Bushy Minor, NP  dextrose 5 % SOLN 250 mL with phenylephrine 10 MG/ML SOLN 40 mg Inject 30-200 mcg/min into the vein continuous. 03/07/14   Grace Bushy Minor, NP  dextrose  5 % SOLN 50 mL with ceFEPIme 1 G SOLR 1 g Inject 1 g into the vein daily. 03/07/14   Grace Bushy Minor, NP  Nutritional Supplements (FEEDING SUPPLEMENT, VITAL HIGH PROTEIN,) LIQD liquid Place 1,000 mLs into feeding tube daily. 03/07/14   Grace Bushy Minor, NP  pantoprazole (PROTONIX) 40 MG injection Inject 40 mg into the vein daily. 03/07/14   Grace Bushy Minor, NP  sodium chloride 0.9 % infusion Inject 100 mLs into the vein continuous. 03/07/14   Grace Bushy Minor, NP  sodium chloride 0.9 % SOLN 200 mL with  fentaNYL 0.05 MG/ML SOLN 2,500 mcg Inject 0-200 mcg/hr into the vein continuous. 03/07/14   Grace Bushy Minor, NP  sodium chloride 0.9 % SOLN 40 mL with midazolam 5 MG/ML SOLN 50 mg Inject 1-10 mg/hr into the vein continuous. 03/07/14   Grace Bushy Minor, NP  Vancomycin (VANCOCIN) 750 MG/150ML SOLN Inject 150 mLs (750 mg total) into the vein daily. 03/07/14   Grace Bushy Minor, NP   Liver Function Tests  Recent Labs Lab 03/06/14 0545 03/07/14 0442 03/16/14 0315  AST 103* 92* 198*  ALT 119* 121* 163*  ALKPHOS 74 70 82  BILITOT 0.5 0.4 0.4  PROT 6.3 5.8* 6.2  ALBUMIN 2.3* 2.0* 1.9*   No results found for this basename: LIPASE, AMYLASE,  in the last 168 hours CBC  Recent Labs Lab 03/06/14 0545 03/07/14 0442 03/16/2014 0315  WBC 12.3* 19.1* 27.4*  NEUTROABS 9.9* 16.0* 25.0*  HGB 9.8* 9.7* 10.5*  HCT 28.5* 28.9* 32.0*  MCV 91.9 94.1 96.4  PLT 244 251 361   Basic Metabolic Panel  Recent Labs Lab 03/09/2014 1226 03/06/14 0545 03/07/14 0442 03-16-2014 0024 03-16-2014 0315  NA 136* 140 137  --  129*  K 3.2* 4.5 3.5*  --  4.4  CL 95* 101 102  --  97  CO2 23 24 21   --  16*  GLUCOSE 121* 109* 116*  --  173*  BUN 28* 24* 24*  --  28*  CREATININE 1.35* 1.24* 1.59*  --  2.93*  CALCIUM 8.5 8.0* 7.7*  --  7.6*  PHOS  --   --  2.8 5.6*  --     Filed Vitals:   03-16-14 1100 Mar 16, 2014 1129 2014-03-16 1216 2014-03-16 1217  BP: 95/75  131/19   Pulse: 62  72   Temp:  97.8 F (36.6 C)    TempSrc:  Oral    Resp: 18  18   Height:      Weight:      SpO2: 92%  90% 90%   Exam Elderly WF on vent , not responding No rash, cyanosis or gangrene Sclera anicteric, throat clear No jvd Chest clear except occ rhonchi RRR no MRG Abd soft, nd, dec'd BS Ext 1+ edema LE"s bilat, symmetric Neuro- eyes slightly open, not responding to voice or touch  UA none CXR bilat air space disease , L >> R with diffuse lung involvement WBC 23k   Hb 10.5  Bun 28   Cr 2.93   CO2 16   Na 129  K 4.4 Ca 7.6    Assessment: 1 Septic shock / bilat PNA 2 Acute kidney failure- due to septic shock, oliguric 3 Hyponatremia, mild 4 Met acidosis, mild 5 Hx afib 6 Hx CVA   Plan- Pt very ill, unlikely that CRRT would change outcome. At this time would recommend conservative care.    Kelly Splinter MD (pgr) [phone]    ([phone]  2014-04-02, 2:32 PM

## 2014-03-26 NOTE — Progress Notes (Signed)
20cc of IV Versed wasted in the sink, verified with PhotographerTeichia RN

## 2014-03-26 NOTE — Progress Notes (Signed)
eLink Physician-Brief Progress Note Patient Name: [name] DresserBetty Daniels DOB: [birth year] MRN: [id number]  Date of Service  03/23/2014   HPI/Events of Note   Family has elected to withdraw care. They have assembled and are ready to begin withdrawal.   eICU Interventions   Withdrawal of care.    Intervention Category Major Interventions: End of life / care limitation discussion  Dartagnan Beavers R. 03/19/2014, 6:43 PM

## 2014-03-26 NOTE — Progress Notes (Signed)
Pep up protocol not followed this am. Increased TF to rate of 40 at 0800. Was infusing at 25cc for the 2 hours since 6 am. Will make up the 30 cc difference by running TF at 55 cc for the next 2 hours and will then decrease back to the goal rate of 40. Pt had 50 cc residual, OG tube in good position per air bolus auscultated over stomach with stethoscope. Will start the deficit correction at 0800 and resume 40 cc hr at 1000 per the protocol.

## 2014-03-26 NOTE — Progress Notes (Signed)
Care was withdraw at 2026 with family at bedside. Patient was extubated, processor stopped. Patient was pronounce death at exactly 2030. No pulse on ascultation, no breathing no B/P and asystole on the monitor. Family was very gracious and thankful for our care. I have made a referral to [facility] of which she is not suitable for any harvest. Patient is transferred to the morgue.

## 2014-03-26 NOTE — Progress Notes (Addendum)
ANTIBIOTIC CONSULT NOTE - Follow-up  Pharmacy Consult for cefepime and vancomycin - now changed to Zosyn and Zyvox Indication: HCAP; pacemaker pocket infection/bacteremia  Allergies  Allergen Reactions  . Levaquin [Levofloxacin In D5w] Nausea And Vomiting    Patient Measurements: Height: 4' 11.06" (150 cm) Weight: 132 lb 11.5 oz (60.2 kg) IBW/kg (Calculated) : 43.33  Vital Signs: Temp: 97.8 F (36.6 C) (03/14 1129) Temp src: Oral (03/14 1129) BP: 95/75 mmHg (03/14 1100) Pulse Rate: 62 (03/14 1100)  Labs:  Recent Labs  03/06/14 0545 03/07/14 0442 2014-03-18 0315  WBC 12.3* 19.1* 27.4*  HGB 9.8* 9.7* 10.5*  PLT 244 251 246  CREATININE 1.24* 1.59* 2.93*   Estimated Creatinine Clearance: 11.9 ml/min (by C-G formula based on Cr of 2.93).  Recent Labs  02/28/2014 1226  VANCORANDOM 19.1     Microbiology: Recent Results (from the past 720 hour(s))  CULTURE, BLOOD (ROUTINE X 2)     Status: None   Collection Time    02/23/14  4:56 PM      Result Value Ref Range Status   Specimen Description BLOOD RIGHT HAND   Final   Special Requests BOTTLES DRAWN AEROBIC ONLY 5CC   Final   Culture  Setup Time     Final   Value: 02/24/2014 03:29     Performed at [facility]   Culture     Final   Value: NO GROWTH 5 DAYS     Performed at [facility]   Report Status 03/02/2014 FINAL   Final  CULTURE, BLOOD (ROUTINE X 2)     Status: None   Collection Time    02/23/14  5:42 PM      Result Value Ref Range Status   Specimen Description BLOOD BLOOD RIGHT FOREARM   Final   Special Requests BOTTLES DRAWN AEROBIC AND ANAEROBIC 5CC EACH   Final   Culture  Setup Time     Final   Value: 02/24/2014 03:29     Performed at [facility]   Culture     Final   Value: STAPHYLOCOCCUS SPECIES (COAGULASE NEGATIVE)     Note: THE SIGNIFICANCE OF ISOLATING THIS ORGANISM FROM A SINGLE SET OF BLOOD CULTURES WHEN MULTIPLE SETS ARE DRAWN IS UNCERTAIN. PLEASE NOTIFY THE MICROBIOLOGY  DEPARTMENT WITHIN ONE WEEK IF SPECIATION AND SENSITIVITIES ARE REQUIRED.     Note: Gram Stain Report Called to,Read Back By and Verified With: ASHLEY EISMA ON 02/24/2014 AT 8:16P BY WILEJ     Performed at [facility]   Report Status 02/27/2014 FINAL   Final  WOUND CULTURE     Status: None   Collection Time    02/24/14  6:10 PM      Result Value Ref Range Status   Specimen Description WOUND   Final   Special Requests SUBCLAVIAN PACEMAKER POCKET   Final   Gram Stain     Final   Value: NO WBC SEEN     NO SQUAMOUS EPITHELIAL CELLS SEEN     NO ORGANISMS SEEN     Performed at [facility]   Culture     Final   Value: NO GROWTH 3 DAYS     Performed at [facility]   Report Status 02/27/2014 FINAL   Final  CULTURE, BLOOD (ROUTINE X 2)     Status: None   Collection Time    02/27/14  9:15 AM      Result Value Ref Range Status  Specimen Description BLOOD RIGHT HAND   Final   Special Requests BOTTLES DRAWN AEROBIC ONLY 5CC   Final   Culture  Setup Time     Final   Value: 02/27/2014 14:50     Performed at [facility]   Culture     Final   Value: NO GROWTH 5 DAYS     Performed at [facility]   Report Status 02/27/2014 FINAL   Final  CULTURE, BLOOD (ROUTINE X 2)     Status: None   Collection Time    02/27/14  9:30 AM      Result Value Ref Range Status   Specimen Description BLOOD LEFT HAND   Final   Special Requests BOTTLES DRAWN AEROBIC ONLY 3CC   Final   Culture  Setup Time     Final   Value: 02/27/2014 14:50     Performed at [facility]   Culture     Final   Value: NO GROWTH 5 DAYS     Performed at [facility]   Report Status 02/24/2014 FINAL   Final  CULTURE, BLOOD (ROUTINE X 2)     Status: None   Collection Time    03/23/2014 12:35 PM      Result Value Ref Range Status   Specimen Description BLOOD PICC LINE RIGHT   Final   Special Requests BOTTLES DRAWN AEROBIC AND ANAEROBIC 5ML   Final   Culture  Setup Time     Final    Value: 03/14/2014 16:09     Performed at [facility]   Culture     Final   Value:        BLOOD CULTURE RECEIVED NO GROWTH TO DATE CULTURE WILL BE HELD FOR 5 DAYS BEFORE ISSUING A FINAL NEGATIVE REPORT     Performed at [facility]   Report Status PENDING   Incomplete  CULTURE, BLOOD (ROUTINE X 2)     Status: None   Collection Time    03/19/2014  1:30 PM      Result Value Ref Range Status   Specimen Description BLOOD ARM LEFT   Final   Special Requests BOTTLES DRAWN AEROBIC AND ANAEROBIC 10CC   Final   Culture  Setup Time     Final   Value: 03/01/2014 20:10     Performed at [facility]   Culture     Final   Value:        BLOOD CULTURE RECEIVED NO GROWTH TO DATE CULTURE WILL BE HELD FOR 5 DAYS BEFORE ISSUING A FINAL NEGATIVE REPORT     Performed at [facility]   Report Status PENDING   Incomplete  MRSA PCR SCREENING     Status: None   Collection Time    02/26/2014 10:50 PM      Result Value Ref Range Status   MRSA by PCR NEGATIVE  NEGATIVE Final   Comment:            The GeneXpert MRSA Assay (FDA     approved for NASAL specimens     only), is one component of a     comprehensive MRSA colonization     surveillance program. It is not     intended to diagnose MRSA     infection nor to guide or     monitor treatment for     MRSA infections.  RESPIRATORY VIRUS PANEL     Status: None   Collection Time  03/06/14  1:05 AM      Result Value Ref Range Status   Source - RVPAN NASOPHARYNGEAL   Final   Respiratory Syncytial Virus A NOT DETECTED   Final   Respiratory Syncytial Virus B NOT DETECTED   Final   Influenza A NOT DETECTED   Final   Influenza B NOT DETECTED   Final   Parainfluenza 1 NOT DETECTED   Final   Parainfluenza 2 NOT DETECTED   Final   Parainfluenza 3 NOT DETECTED   Final   Metapneumovirus NOT DETECTED   Final   Rhinovirus NOT DETECTED   Final   Adenovirus NOT DETECTED   Final   Influenza A H1 NOT DETECTED   Final   Influenza A H3  NOT DETECTED   Final   Comment: (NOTE)           Normal Reference Range for each Analyte: NOT DETECTED     Testing performed using the Luminex xTAG Respiratory Viral Panel test     kit.     This test was developed and its performance characteristics determined     by [facility]. It has not been cleared or approved by the [place]     Food and Drug Administration. This test is used for clinical purposes.     It should not be regarded as investigational or for research. This     laboratory is certified under the [place] (CLIA) as qualified to perform high complexity     clinical laboratory testing.     Performed at [place]     Status: None   Collection Time    03/06/14  2:54 PM      Result Value Ref Range Status   Specimen Description BRONCHIAL WASHINGS   Final   Special Requests LEFT MAISTEM WASHING   Final   Gram Stain     Final   Value: FEW WBC PRESENT,BOTH PMN AND MONONUCLEAR     NO ORGANISMS SEEN   Report Status 03/06/2014 FINAL   Final   Assessment: 80 yo F on Cefepime Day #4 and vancomycin Day #13 of planned 14 days for HCAP/pacemaker pocket infxn/bacteremia. Afeb. WBC up to 27.4. Scr up to 2.93! PCT up to 3.26.  Cefepime 3/11>> Vanc 3/1>>  3/11 Blood - NGTD 3/11 MRSA - NEG 3/1 Blood - 1/2 CNS 3/5 Blood - NEG 3/12 RSV panel neg  Goal of Therapy:  Vanc trough 15-20 mcg/ml  Plan:  1. D/c scheduled vanc for now - will check trough tonight with doubling SCr. 2. Continue cefepime 1gm IV Q24H 3. F/u renal fxn, micro data, clinical status  Sherlon Handing, PharmD, BCPS Clinical pharmacist, pager [phone] 03-09-14 11:49 AM   Addendum: MD changing antibiotics to Zyvox and Zosyn as pt with new signs and symptoms of sepsis. Plan to reconsult cardiology to re-eval pacemaker infection.  1) Zyvox 620m IV q12h. First dose now. 2) Zosyn 2.25gm IV q8h. 3) Will f/u renal function, pt's clinical  condition, micro data  CSherlon Handing PharmD, BCPS Clinical pharmacist, pager [phone]/15/15 1:02 PM

## 2014-03-26 NOTE — Progress Notes (Addendum)
PULMONARY / CRITICAL CARE MEDICINE  Name: [name] Daniels MRN: [id number] DOB: 1930/06/22    ADMISSION DATE:  03/17/2014 CONSULTATION DATE:  03/07/2014  REFERRING MD :  EDP PRIMARY SERVICE:  TRH  CHIEF COMPLAINT:  SOB  BRIEF PATIENT DESCRIPTION: 80 yo with recent hospitalization for bacteremia secondary to infected pacemaker, sent to ED from PCP office for dyspnea and hypoxia.  Chest imaging demonstrated L lung volume loss with L mainstem bronchus cut off sign and associated pleural effusion.   has past surgical history that includes Tonsillectomy (~ 1950); Cataract extraction w/ intraocular lens implant (Left, 07/08/2013); Pacemaker insertion (07-10-2013); Pacemaker removal (02/25/14); and TEE without cardioversion (N/A, 02/26/2014).   has a past medical history of Atrial fibrillation; GERD (gastroesophageal reflux disease); Stroke, lacunar-identified by CT scanning (01/01/2012); CHF (congestive heart failure); Pneumonia (06/2013); Renal insufficiency; Bacteremia (02/23/2014); and Chronotropic incompetence with sinus node dysfunction (10/18/2013).   SIGNIFICANT EVENTS / STUDIES:  3/11  CT Chest >>> Occluded left mainstem bronchus with complete collapse of left lung and associated effusion 3/12  FOB >>> Barium aspiration likely, near complete obstruction of lobar airways on the left  LINES / TUBES: OETT 3/12 >>>  CULTURES: 3/11 Blood >>> Results for orders placed during the hospital encounter of 03/10/2014  CULTURE, BLOOD (ROUTINE X 2)     Status: None   Collection Time    03/22/2014 12:35 PM      Result Value Ref Range Status   Specimen Description BLOOD PICC LINE RIGHT   Final   Special Requests BOTTLES DRAWN AEROBIC AND ANAEROBIC 5ML   Final   Culture  Setup Time     Final   Value: 03/07/2014 16:09     Performed at [facility]   Culture     Final   Value:        BLOOD CULTURE RECEIVED NO GROWTH TO DATE CULTURE WILL BE HELD FOR 5 DAYS BEFORE ISSUING A FINAL NEGATIVE REPORT   Performed at [facility]   Report Status PENDING   Incomplete  CULTURE, BLOOD (ROUTINE X 2)     Status: None   Collection Time    03/15/2014  1:30 PM      Result Value Ref Range Status   Specimen Description BLOOD ARM LEFT   Final   Special Requests BOTTLES DRAWN AEROBIC AND ANAEROBIC 10CC   Final   Culture  Setup Time     Final   Value: 03/17/2014 20:10     Performed at [facility]   Culture     Final   Value:        BLOOD CULTURE RECEIVED NO GROWTH TO DATE CULTURE WILL BE HELD FOR 5 DAYS BEFORE ISSUING A FINAL NEGATIVE REPORT     Performed at [facility]   Report Status PENDING   Incomplete  MRSA PCR SCREENING     Status: None   Collection Time    03/04/2014 10:50 PM      Result Value Ref Range Status   MRSA by PCR NEGATIVE  NEGATIVE Final   Comment:            The GeneXpert MRSA Assay (FDA     approved for NASAL specimens     only), is one component of a     comprehensive MRSA colonization     surveillance program. It is not     intended to diagnose MRSA     infection nor to guide or     monitor treatment  for     MRSA infections.  RESPIRATORY VIRUS PANEL     Status: None   Collection Time    03/06/14  1:05 AM      Result Value Ref Range Status   Source - RVPAN NASOPHARYNGEAL   Final   Respiratory Syncytial Virus A NOT DETECTED   Final   Respiratory Syncytial Virus B NOT DETECTED   Final   Influenza A NOT DETECTED   Final   Influenza B NOT DETECTED   Final   Parainfluenza 1 NOT DETECTED   Final   Parainfluenza 2 NOT DETECTED   Final   Parainfluenza 3 NOT DETECTED   Final   Metapneumovirus NOT DETECTED   Final   Rhinovirus NOT DETECTED   Final   Adenovirus NOT DETECTED   Final   Influenza A H1 NOT DETECTED   Final   Influenza A H3 NOT DETECTED   Final   Comment: (NOTE)           Normal Reference Range for each Analyte: NOT DETECTED     Testing performed using the Luminex xTAG Respiratory Viral Panel test     kit.     This test was developed  and its performance characteristics determined     by [facility]. It has not been cleared or approved by the [place]     Food and Drug Administration. This test is used for clinical purposes.     It should not be regarded as investigational or for research. This     laboratory is certified under the [place] (CLIA) as qualified to perform high complexity     clinical laboratory testing.     Performed at [place]     Status: None   Collection Time    03/06/14  2:54 PM      Result Value Ref Range Status   Specimen Description BRONCHIAL WASHINGS   Final   Special Requests LEFT MAISTEM WASHING   Final   Gram Stain     Final   Value: FEW WBC PRESENT,BOTH PMN AND MONONUCLEAR     NO ORGANISMS SEEN   Report Status 03/06/2014 FINAL   Final     ANTIBIOTICS: Cefepime 3/11 >>>3/14 Vancomycin 3/1 >>>3/14 ... Zosyn 3/14 >>  Anti-infectives   Start     Dose/Rate Route Frequency Ordered Stop   03/07/14 0000  dextrose 5 % SOLN 50 mL with ceFEPIme 1 G SOLR 1 g     1 g 100 mL/hr over 30 Minutes Intravenous Every 24 hours 03/07/14 1200     03/07/14 0000  Vancomycin (VANCOCIN) 750 MG/150ML SOLN     750 mg 150 mL/hr over 60 Minutes Intravenous Every 24 hours 03/07/14 1200     03/01/2014 1930  ceFEPIme (MAXIPIME) 1 g in dextrose 5 % 50 mL IVPB  Status:  Discontinued     1 g 100 mL/hr over 30 Minutes Intravenous Every 8 hours 03/10/2014 1717 03/14/2014 1850   03/13/2014 1800  vancomycin (VANCOCIN) IVPB 750 mg/150 ml premix  Status:  Discontinued     750 mg 150 mL/hr over 60 Minutes Intravenous Every 24 hours 03/20/2014 1745 April 06, 2014 1214   03/02/2014 1345  ceFEPIme (MAXIPIME) 1 g in dextrose 5 % 50 mL IVPB     1 g 100 mL/hr over 30 Minutes Intravenous Every 24 hours 03/06/2014 1332         INTERVAL HISTORY:  2014/03/28: Awaiting bed at [facility] but mor hypotensive; nep 200, leveophed 19mg. Fio2 60%. On fent gtt  VITAL SIGNS: Temp:   [97.4 F (36.3 C)-98.9 F (37.2 C)] 97.8 F (36.6 C) (03/14 1129) Pulse Rate:  [59-77] 72 (03/14 1216) Resp:  [13-33] 18 (03/14 1216) BP: (88-133)/(19-75) 131/19 mmHg (03/14 1216) SpO2:  [84 %-100 %] 90 % (03/14 1216) FiO2 (%):  [50 %-70 %] 60 % (03/14 1216) Weight:  [60.2 kg (132 lb 11.5 oz)] 60.2 kg (132 lb 11.5 oz) (03/14 0500)  PHYSICAL EXAMINATION: General: Looks moribund and critically ill Neuro: Well sedated HEENT: OETT Cardiovascular: Irregular, no murmurs Chest - left pacer site - dressing +. ?pus through it. Last change 3/11 Lungs: on vent. Flow starvation on vent. Atempts to double . Atriggerir movement diminished over L lung field, few rhonchi, weak cough Abdomen: soft, non tender Musculoskeletal: No edema.  Skin: No rash   LABS  PULMONARY No results found for this basename: PHART, PCO2, PCO2ART, PO2, PO2ART, HCO3, TCO2, O2SAT,  in the last 168 hours  CBC  Recent Labs Lab 03/06/14 0545 03/07/14 0442 0April 03, [id number]  HGB 9.8* 9.7* 10.5*  HCT 28.5* 28.9* 32.0*  WBC 12.3* 19.1* 27.4*  PLT 244 251 246    COAGULATION  Recent Labs Lab 03/02/2014 1226 03/06/14 0545 03/07/14 0442 [id number]  INR 1.50* 1.64* 2.09* 2.21*    CARDIAC  No results found for this basename: TROPONINI,  in the last 168 hours  Recent Labs Lab 03/10/2014 1226  PROBNP 2930.0*     CHEMISTRY  Recent Labs Lab 03/09/2014 1226 03/06/14 0545 03/07/14 0442 [id number]  NA 136* 140 137  --  129*  K 3.2* 4.5 3.5*  --  4.4  CL 95* 101 102  --  97  CO2 23 24 21   --  16*  GLUCOSE 121* 109* 116*  --  173*  BUN 28* 24* 24*  --  28*  CREATININE 1.35* 1.24* 1.59*  --  2.93*  CALCIUM 8.5 8.0* 7.7*  --  7.6*  MG  --  1.8 1.6  --  1.6  PHOS  --   --  2.8 5.6*  --    Estimated Creatinine Clearance: 11.9 ml/min (by C-G formula based on Cr of 2.93).   LIVER  Recent Labs Lab 03/12/2014 1226 03/06/14 0545 03/07/14 0442 0April 03, [id number]  AST  --  103* 92* 198*  ALT   --  119* 121* 163*  ALKPHOS  --  74 70 82  BILITOT  --  0.5 0.4 0.4  PROT  --  6.3 5.8* 6.2  ALBUMIN  --  2.3* 2.0* 1.9*  INR 1.50* 1.64* 2.09* 2.21*     INFECTIOUS  Recent Labs Lab 03/18/2014 1256 03/06/14 1425 03/07/14 0442 [id number]  LATICACIDVEN 0.97  --   --   --   PROCALCITON  --  0.17 0.41 3.26     ENDOCRINE CBG (last 3)   Recent Labs  0Apr 03, [id number]  GLUCAP 158* 201* 177*         IMAGING x48h  Dg Chest Port 1 View  03/07/2014   CLINICAL DATA:  Infiltrates.  EXAM: PORTABLE CHEST - 1 VIEW  COMPARISON:  DG CHEST 1V PORT dated 03/06/2014; CT CHEST W/O CM dated 03/10/2014  FINDINGS: Endotracheal tube and right PICC line in stable position. Mediastinum and hilar structures are stable. Stable cardiomegaly. Slight improvement in aeration of the left lung with persistent dense infiltrates throughout  the left lung. Persistent left lower lobe atelectatic changes. Interim progression of patchy infiltrates throughout the right upper lobe. No pneumothorax.  IMPRESSION: 1. Lines and tubes in stable position. 2. Interim improved aeration left lung with persistent dense infiltrate in the left lung particularly left upper lobe and persistent left lower lobe atelectatic change. 3. Progressive patchy pulmonary infiltrates right lung, particular right upper lobe.   Electronically Signed   By: Marcello Moores  Register   On: 03/07/2014 08:34   Dg Chest Port 1 View  03/06/2014   CLINICAL DATA:  Check for infiltrate  EXAM: PORTABLE CHEST - 1 VIEW  COMPARISON:  03/06/2014  FINDINGS: Cardiac shadow is stable. There is improved aeration in the left hemi thorax although persistent density is noted in the left suprahilar region likely representing an underlying mass/consolidated lung. Continued followup is recommended. A PICC line is seen with the tip in the mid superior vena cava. It is stable in appearance. An endotracheal tube is been placed and lies 1.8 cm above the  carina. Diffuse infiltrative changes are noted throughout the right lung stable from the prior exam.  IMPRESSION: Improved aeration in the left hemi thorax with persistent consolidation/mass lesion in the left upper lobe.  Endotracheal tube in satisfactory position.  Stable changes in the right lung.   Electronically Signed   By: Inez Catalina M.D.   On: 03/06/2014 16:32      ASSESSMENT / PLAN: PULMONARY A Acute respiratory failure Left lung atelectasis, likely to barium aspiration Possible HCAP vs aspiration pneumonia given episodes of emesis Left pleural effusion secondary to lung collapse, less likely parapneumonic effusion or empyema Hypoxemic respiratory failure DNR/DNI status, however would not oppose short term intubation for reversible cause Transient hypotension, likely secondary to sedation / hypovolemia Renal failure, acute on chronic Hypokalemia  P -->  Mechanical ventilation -->  Discussed with Interventional Pulmonology at [facility], awaiting transfer -->  Hold Coumadin as may require thoracentesis / biopsy -->  Continue Albuterol / Mucomyst Nebs -->  D/c chest physical therapy to L lung -->  Follow CXR   ASSESSMENT / PLAN:  CARDIOVASCULAR A: Circulator shock P:  Levophed Neo Vaso Map goal > 65 Cycle enzymes Cards consult to evaluate pacer dressing:   RENAL A:  Acute renal failure P:   Hemodynamic support Renal consult   GASTROINTESTINAL A:  Hx of aspiration P:   TF  HEMATOLOGIC A:  Anemia of critical illness P:  PRBC for hgb < 7gm%  INFECTIOUS A:  Likely septi shock. Has finished 13 d vanc for pacer infection - coag negative staph. Last dressing changed 03/18/2014 P:   Dc vanc dC cefepime Start zyvox and zosyn  ENDOCRINE A:  Possible relative adrenal insuff P:   Check cortisol and start hydrocort  NEUROLOGIC A:  Deeply sedated P:   fent gtt  TODAY'S SUMMARY: Moribund. MODS. Explained HCPOA Debbie and spoke to PCP Imagene Riches, NP  expalined poor prognosis. Will not survive a [facility] transfer. Niece in significant distress. Will call renal consult and cards consult. Full medical cae but DNAR if arrests. Also continue current palliation. Niece wants cards and renal opinion as well - and if prognosis grim will palliate/terminal wean.  Call chaplain to bedside.       The patient is critically ill with multiple organ systems failure and requires high complexity decision making for assessment and support, frequent evaluation and titration of therapies, application of advanced monitoring technologies and extensive interpretation of multiple databases.   Critical Care  Time devoted to patient care services described in this note is  45  Minutes.   Dr. Brand Males, M.D., [facility].C.P Pulmonary and Critical Care Medicine Staff Physician [facility] Pager: [phone], If no answer or between  15:00h - 7:00h: call [phone]  04/05/14 12:37 PM

## 2014-03-26 DEATH — deceased

## 2014-12-04 ENCOUNTER — Encounter (HOSPITAL_COMMUNITY): Payer: Self-pay | Admitting: Internal Medicine

## 2015-01-08 ENCOUNTER — Encounter (HOSPITAL_COMMUNITY): Payer: Self-pay | Admitting: Cardiovascular Disease

## 2015-07-25 IMAGING — CR DG CHEST 2V
2 series · 2 of 2 positions shown · non-contrast
Comparison: 09/11/2013

CLINICAL DATA: Pacemaker infection.  Evaluate pacemaker leads.

EXAM:
CHEST  2 VIEW

[w chest pa]
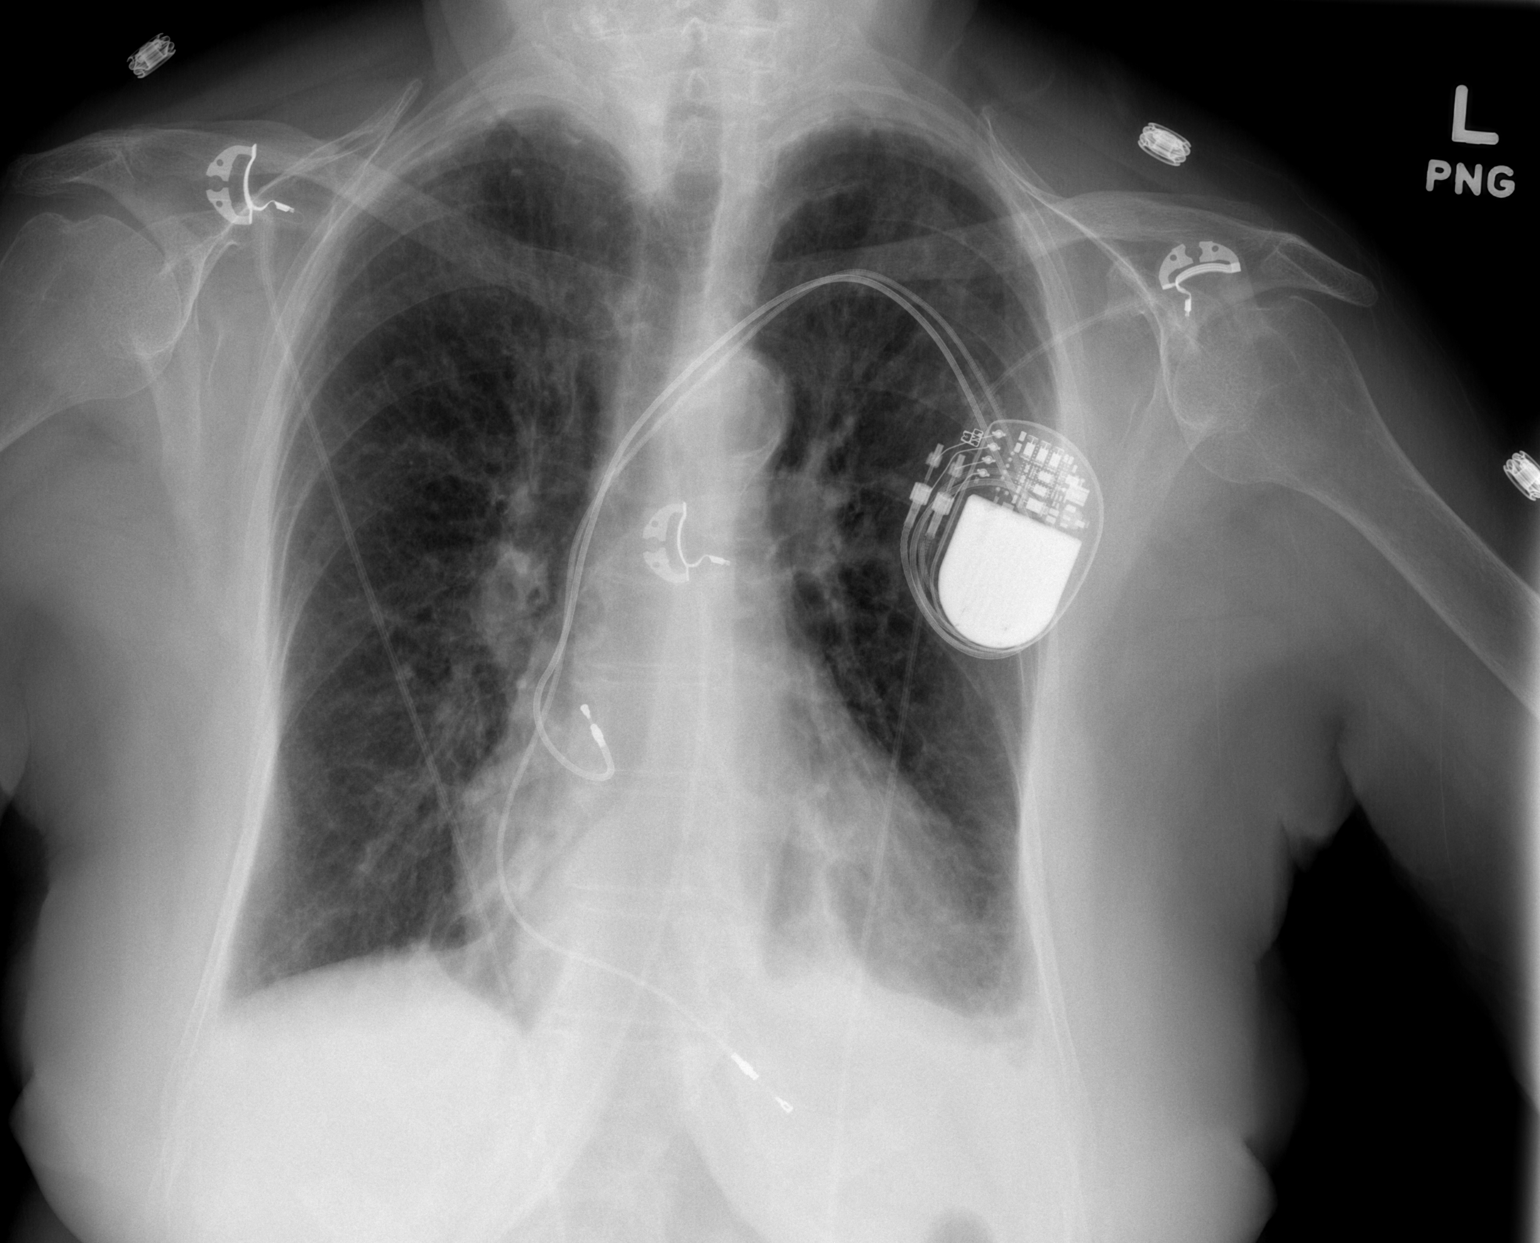

[w chest lat]
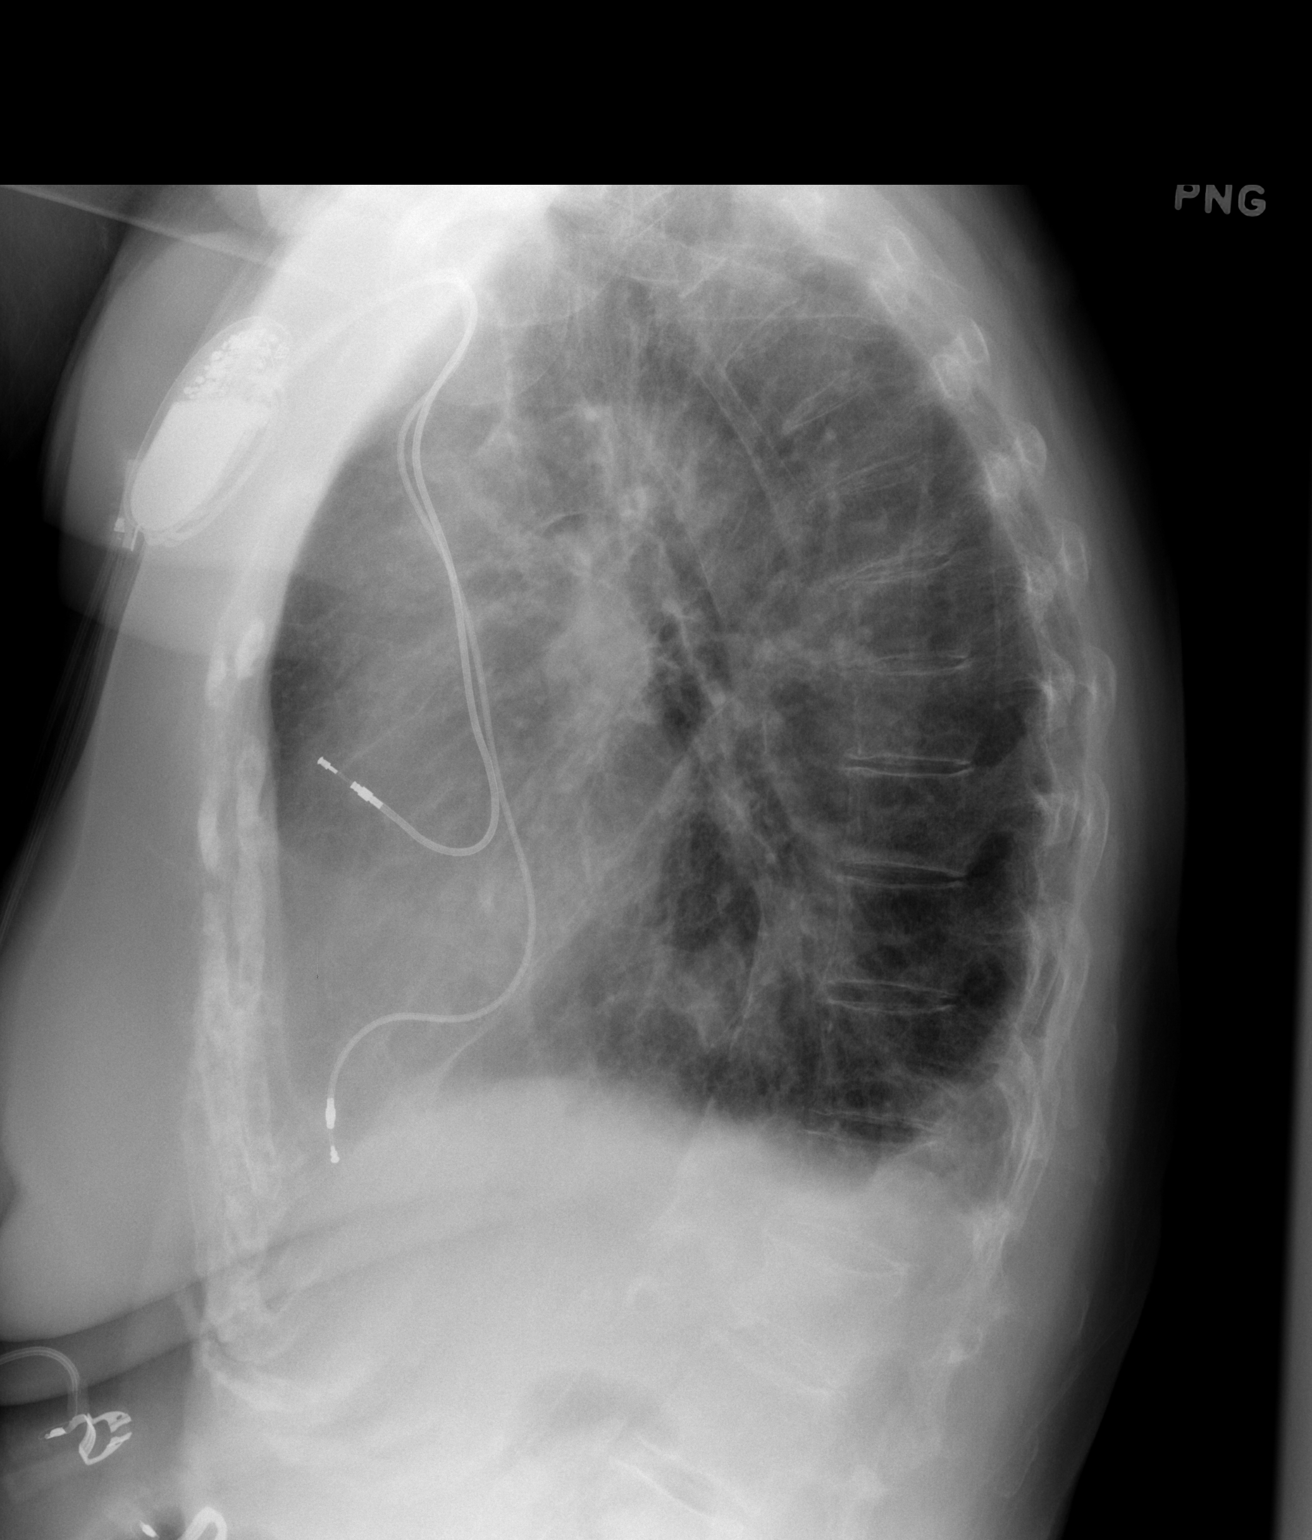

[2 of 2 positions shown; findings below may reference images not displayed]

FINDINGS: Sequential pacemaker leads project within the right atrium right
ventricle, stable from the prior study.

Lungs are hyperexpanded. There is upper lobe scarring. Chronic
opacity is noted at the left lung base consistent with a combination
of a small chronic effusion with atelectasis/scarring. No convincing
infiltrate. No pulmonary edema.

Cardiac silhouette is mildly enlarged. Mediastinum is normal in
contour.
IMPRESSION: 1. No acute cardiopulmonary disease
2. Pacer leads are well positioned and stable from the prior study.

## 2015-08-05 IMAGING — DX DG CHEST 1V PORT
1 series · 1 of 1 positions shown · non-contrast
Comparison: 03/06/2014

CLINICAL DATA: Check for infiltrate

EXAM:
PORTABLE CHEST - 1 VIEW

[portable]
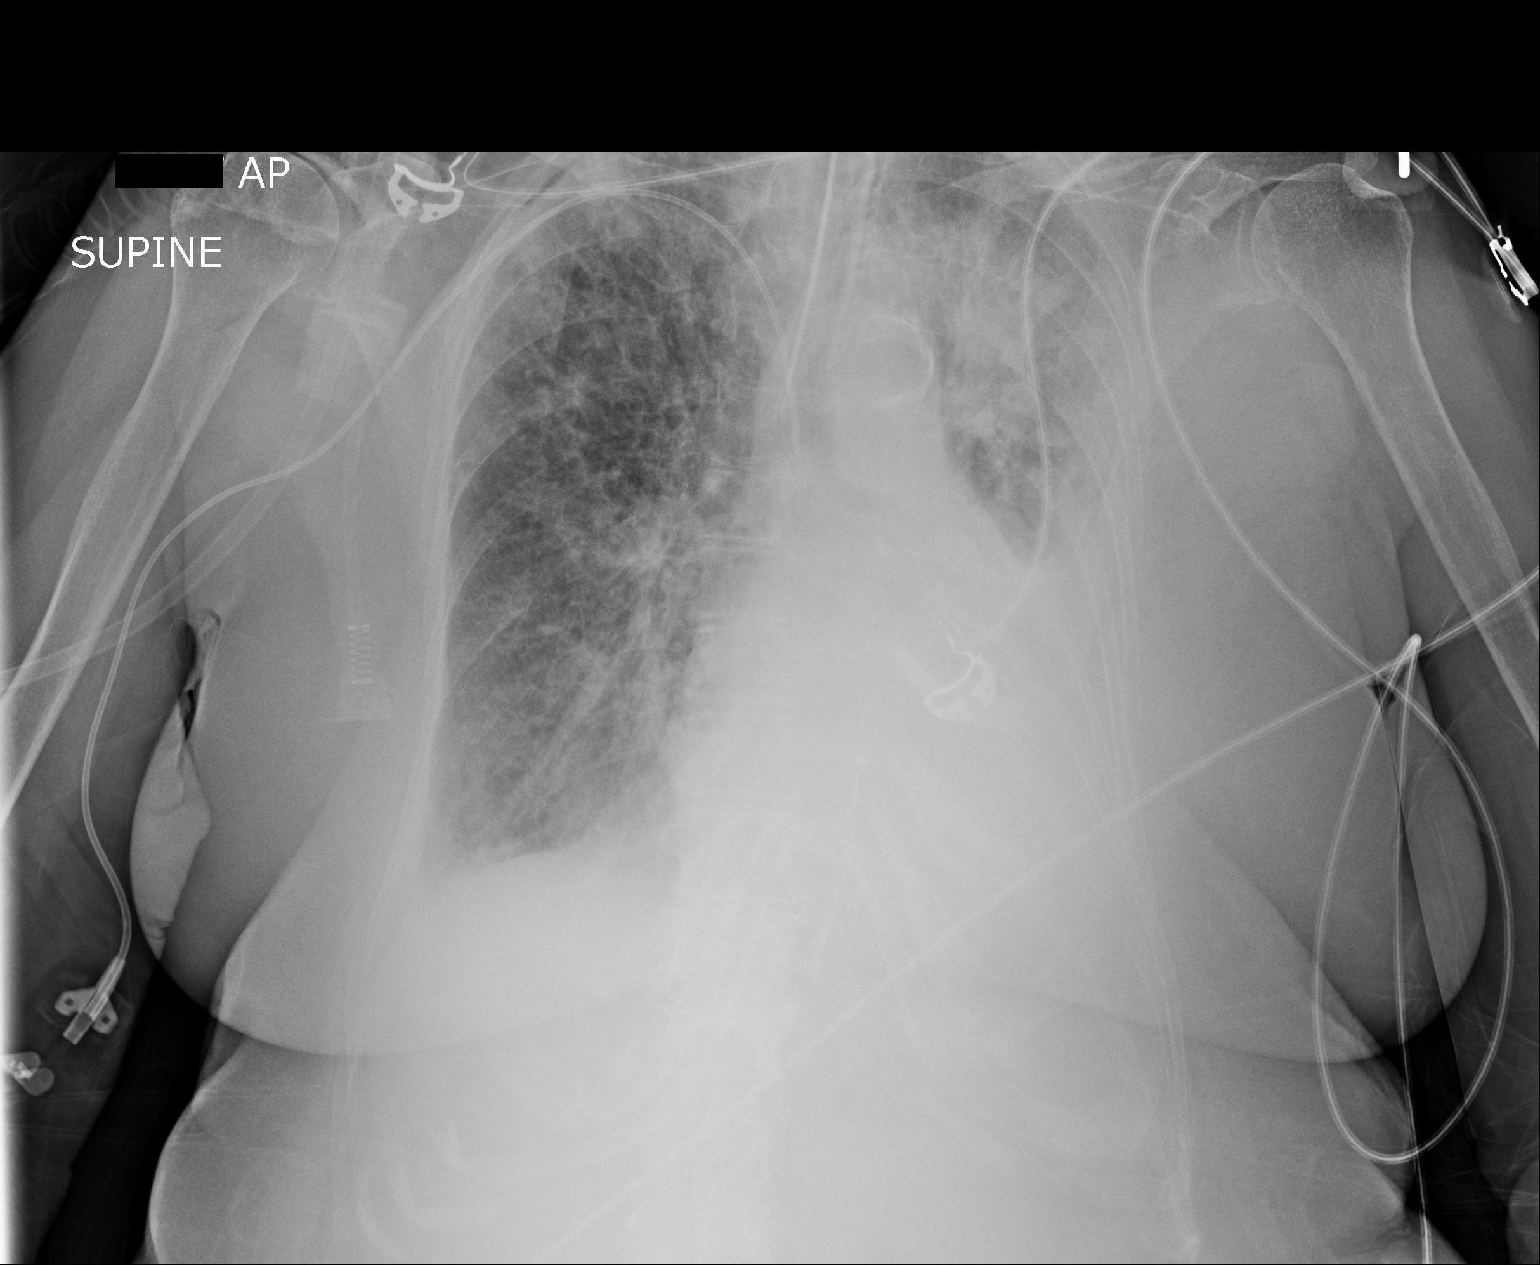

[1 of 1 positions shown; findings below may reference images not displayed]

FINDINGS: Cardiac shadow is stable. There is improved aeration in the left
hemi thorax although persistent density is noted in the left
suprahilar region likely representing an underlying
mass/consolidated lung. Continued followup is recommended. A PICC
line is seen with the tip in the mid superior vena cava. It is
stable in appearance. An endotracheal tube is been placed and lies
1.8 cm above the carina. Diffuse infiltrative changes are noted
throughout the right lung stable from the prior exam.
IMPRESSION: Improved aeration in the left hemi thorax with persistent
consolidation/mass lesion in the left upper lobe.

Endotracheal tube in satisfactory position.

Stable changes in the right lung.
